# Patient Record
Sex: Female | Born: 1980 | Race: White | Hispanic: No | Marital: Married | Smoking: Never smoker
Health system: Southern US, Community
[De-identification: ages and names within clinical notes are randomized; demographics above are authoritative.]

## PROBLEM LIST (undated history)

## (undated) ENCOUNTER — Inpatient Hospital Stay (HOSPITAL_COMMUNITY): Payer: Self-pay

## (undated) DIAGNOSIS — F988 Other specified behavioral and emotional disorders with onset usually occurring in childhood and adolescence: Secondary | ICD-10-CM

## (undated) DIAGNOSIS — M2669 Other specified disorders of temporomandibular joint: Secondary | ICD-10-CM

## (undated) DIAGNOSIS — I10 Essential (primary) hypertension: Secondary | ICD-10-CM

## (undated) DIAGNOSIS — J45909 Unspecified asthma, uncomplicated: Secondary | ICD-10-CM

## (undated) DIAGNOSIS — M1990 Unspecified osteoarthritis, unspecified site: Secondary | ICD-10-CM

## (undated) DIAGNOSIS — K219 Gastro-esophageal reflux disease without esophagitis: Secondary | ICD-10-CM

## (undated) DIAGNOSIS — M879 Osteonecrosis, unspecified: Secondary | ICD-10-CM

## (undated) DIAGNOSIS — F419 Anxiety disorder, unspecified: Secondary | ICD-10-CM

## (undated) DIAGNOSIS — K589 Irritable bowel syndrome without diarrhea: Secondary | ICD-10-CM

## (undated) DIAGNOSIS — I7300 Raynaud's syndrome without gangrene: Secondary | ICD-10-CM

## (undated) DIAGNOSIS — E71310 Long chain/very long chain acyl CoA dehydrogenase deficiency: Secondary | ICD-10-CM

## (undated) DIAGNOSIS — B009 Herpesviral infection, unspecified: Secondary | ICD-10-CM

## (undated) HISTORY — PX: KNEE ARTHROSCOPY: SUR90

## (undated) HISTORY — PX: WISDOM TOOTH EXTRACTION: SHX21

## (undated) HISTORY — PX: JOINT REPLACEMENT: SHX530

## (undated) HISTORY — PX: REPLACEMENT TOTAL KNEE BILATERAL: SUR1225

## (undated) HISTORY — DX: Other specified disorders of temporomandibular joint: M26.69

## (undated) HISTORY — DX: Osteonecrosis, unspecified: M87.9

## (undated) HISTORY — DX: Anxiety disorder, unspecified: F41.9

---
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
## 2001-08-04 ENCOUNTER — Other Ambulatory Visit: Admission: RE | Admit: 2001-08-04 | Discharge: 2001-08-04 | Payer: Self-pay | Admitting: Obstetrics and Gynecology

## 2002-09-30 ENCOUNTER — Other Ambulatory Visit: Admission: RE | Admit: 2002-09-30 | Discharge: 2002-09-30 | Payer: Self-pay | Admitting: Obstetrics and Gynecology

## 2003-05-19 ENCOUNTER — Encounter: Admission: RE | Admit: 2003-05-19 | Discharge: 2003-05-19 | Payer: Self-pay | Admitting: Family Medicine

## 2003-12-26 ENCOUNTER — Other Ambulatory Visit: Admission: RE | Admit: 2003-12-26 | Discharge: 2003-12-26 | Payer: Self-pay | Admitting: Obstetrics and Gynecology

## 2003-12-26 ENCOUNTER — Other Ambulatory Visit: Admission: RE | Admit: 2003-12-26 | Discharge: 2003-12-26 | Payer: Self-pay | Admitting: *Deleted

## 2004-05-22 ENCOUNTER — Ambulatory Visit (HOSPITAL_COMMUNITY): Admission: RE | Admit: 2004-05-22 | Discharge: 2004-05-22 | Payer: Self-pay | Admitting: Allergy

## 2004-06-03 ENCOUNTER — Emergency Department (HOSPITAL_COMMUNITY): Admission: EM | Admit: 2004-06-03 | Discharge: 2004-06-03 | Payer: Self-pay | Admitting: Emergency Medicine

## 2004-10-03 ENCOUNTER — Ambulatory Visit (HOSPITAL_BASED_OUTPATIENT_CLINIC_OR_DEPARTMENT_OTHER): Admission: RE | Admit: 2004-10-03 | Discharge: 2004-10-03 | Payer: Self-pay | Admitting: Orthopedic Surgery

## 2005-01-21 ENCOUNTER — Other Ambulatory Visit: Admission: RE | Admit: 2005-01-21 | Discharge: 2005-01-21 | Payer: Self-pay | Admitting: Obstetrics and Gynecology

## 2006-01-22 ENCOUNTER — Other Ambulatory Visit: Admission: RE | Admit: 2006-01-22 | Discharge: 2006-01-22 | Payer: Self-pay | Admitting: Obstetrics and Gynecology

## 2008-12-08 ENCOUNTER — Encounter: Admission: RE | Admit: 2008-12-08 | Discharge: 2008-12-08 | Payer: Self-pay

## 2009-06-21 ENCOUNTER — Emergency Department (HOSPITAL_COMMUNITY): Admission: EM | Admit: 2009-06-21 | Discharge: 2009-06-21 | Payer: Self-pay | Admitting: Emergency Medicine

## 2009-12-25 ENCOUNTER — Inpatient Hospital Stay (HOSPITAL_COMMUNITY): Admission: RE | Admit: 2009-12-25 | Discharge: 2009-12-29 | Payer: Self-pay | Admitting: Orthopedic Surgery

## 2010-05-18 ENCOUNTER — Inpatient Hospital Stay (HOSPITAL_COMMUNITY)
Admission: RE | Admit: 2010-05-18 | Discharge: 2010-05-22 | Payer: Self-pay | Source: Home / Self Care | Admitting: Orthopedic Surgery

## 2010-09-18 LAB — BASIC METABOLIC PANEL
BUN: 3 mg/dL — ABNORMAL LOW (ref 6–23)
BUN: 3 mg/dL — ABNORMAL LOW (ref 6–23)
BUN: 4 mg/dL — ABNORMAL LOW (ref 6–23)
CO2: 27 mEq/L (ref 19–32)
CO2: 29 mEq/L (ref 19–32)
CO2: 31 mEq/L (ref 19–32)
Calcium: 8.4 mg/dL (ref 8.4–10.5)
Calcium: 8.4 mg/dL (ref 8.4–10.5)
Calcium: 8.8 mg/dL (ref 8.4–10.5)
Chloride: 101 mEq/L (ref 96–112)
Chloride: 104 mEq/L (ref 96–112)
Chloride: 107 mEq/L (ref 96–112)
Creatinine, Ser: 0.71 mg/dL (ref 0.4–1.2)
Creatinine, Ser: 0.71 mg/dL (ref 0.4–1.2)
Creatinine, Ser: 0.72 mg/dL (ref 0.4–1.2)
GFR calc Af Amer: >60 mL/min (ref >60)
GFR calc Af Amer: >60 mL/min (ref >60)
GFR calc Af Amer: >60 mL/min (ref >60)
GFR calc non Af Amer: >60 mL/min (ref >60)
GFR calc non Af Amer: >60 mL/min (ref >60)
GFR calc non Af Amer: >60 mL/min (ref >60)
Glucose, Bld: 104 mg/dL — ABNORMAL HIGH (ref 70–99)
Glucose, Bld: 174 mg/dL — ABNORMAL HIGH (ref 70–99)
Glucose, Bld: 95 mg/dL (ref 70–99)
Potassium: 3.4 mEq/L — ABNORMAL LOW (ref 3.5–5.1)
Potassium: 3.6 mEq/L (ref 3.5–5.1)
Potassium: 4.2 mEq/L (ref 3.5–5.1)
Sodium: 137 mEq/L (ref 135–145)
Sodium: 139 mEq/L (ref 135–145)
Sodium: 140 mEq/L (ref 135–145)

## 2010-09-18 LAB — CBC
HCT: 28.2 % — ABNORMAL LOW (ref 36.0–46.0)
HCT: 28.7 % — ABNORMAL LOW (ref 36.0–46.0)
HCT: 32 % — ABNORMAL LOW (ref 36.0–46.0)
HCT: 42.6 % (ref 36.0–46.0)
Hemoglobin: 10.9 g/dL — ABNORMAL LOW (ref 12.0–15.0)
Hemoglobin: 14.5 g/dL (ref 12.0–15.0)
Hemoglobin: 9.8 g/dL — ABNORMAL LOW (ref 12.0–15.0)
Hemoglobin: 9.9 g/dL — ABNORMAL LOW (ref 12.0–15.0)
MCH: 27.7 pg (ref 26.0–34.0)
MCH: 28.3 pg (ref 26.0–34.0)
MCH: 28.4 pg (ref 26.0–34.0)
MCH: 28.6 pg (ref 26.0–34.0)
MCHC: 34 g/dL (ref 30.0–36.0)
MCHC: 34.2 g/dL (ref 30.0–36.0)
MCHC: 34.5 g/dL (ref 30.0–36.0)
MCHC: 34.6 g/dL (ref 30.0–36.0)
MCV: 81.4 fL (ref 78.0–100.0)
MCV: 82.4 fL (ref 78.0–100.0)
MCV: 82.6 fL (ref 78.0–100.0)
MCV: 82.8 fL (ref 78.0–100.0)
Platelets: 314 10*3/uL (ref 150–400)
Platelets: 321 10*3/uL (ref 150–400)
Platelets: 374 10*3/uL (ref 150–400)
Platelets: 443 10*3/uL — ABNORMAL HIGH (ref 150–400)
RBC: 3.42 MIL/uL — ABNORMAL LOW (ref 3.87–5.11)
RBC: 3.49 MIL/uL — ABNORMAL LOW (ref 3.87–5.11)
RBC: 3.87 MIL/uL (ref 3.87–5.11)
RBC: 5.24 MIL/uL — ABNORMAL HIGH (ref 3.87–5.11)
RDW: 13 % (ref 11.5–15.5)
RDW: 13.6 % (ref 11.5–15.5)
RDW: 13.8 % (ref 11.5–15.5)
RDW: 13.9 % (ref 11.5–15.5)
WBC: 13.3 10*3/uL — ABNORMAL HIGH (ref 4.0–10.5)
WBC: 15 10*3/uL — ABNORMAL HIGH (ref 4.0–10.5)
WBC: 15.4 10*3/uL — ABNORMAL HIGH (ref 4.0–10.5)
WBC: 19.6 10*3/uL — ABNORMAL HIGH (ref 4.0–10.5)

## 2010-09-18 LAB — URINALYSIS, ROUTINE W REFLEX MICROSCOPIC
Bilirubin Urine: NEGATIVE
Glucose, UA: NEGATIVE mg/dL
Hgb urine dipstick: NEGATIVE
Ketones, ur: NEGATIVE mg/dL
Nitrite: NEGATIVE
Protein, ur: NEGATIVE mg/dL
Specific Gravity, Urine: 1.007 (ref 1.005–1.030)
Urobilinogen, UA: 0.2 mg/dL (ref 0.0–1.0)
pH: 7 (ref 5.0–8.0)

## 2010-09-18 LAB — COMPREHENSIVE METABOLIC PANEL
ALT: 28 U/L (ref 0–35)
AST: 26 U/L (ref 0–37)
Albumin: 4.6 g/dL (ref 3.5–5.2)
Alkaline Phosphatase: 96 U/L (ref 39–117)
BUN: 10 mg/dL (ref 6–23)
CO2: 28 mEq/L (ref 19–32)
Calcium: 10 mg/dL (ref 8.4–10.5)
Chloride: 101 mEq/L (ref 96–112)
Creatinine, Ser: 0.84 mg/dL (ref 0.4–1.2)
GFR calc Af Amer: >60 mL/min (ref >60)
GFR calc non Af Amer: >60 mL/min (ref >60)
Glucose, Bld: 94 mg/dL (ref 70–99)
Potassium: 3.7 mEq/L (ref 3.5–5.1)
Sodium: 140 mEq/L (ref 135–145)
Total Bilirubin: 0.8 mg/dL (ref 0.3–1.2)
Total Protein: 7.8 g/dL (ref 6.0–8.3)

## 2010-09-18 LAB — TYPE AND SCREEN
ABO/RH(D): AB POS
Antibody Screen: NEGATIVE

## 2010-09-18 LAB — SURGICAL PCR SCREEN
MRSA, PCR: NEGATIVE
Staphylococcus aureus: NEGATIVE

## 2010-09-18 LAB — PREGNANCY, URINE: Preg Test, Ur: NEGATIVE

## 2010-09-18 LAB — PROTIME-INR
INR: 0.99 (ref 0.00–1.49)
Prothrombin Time: 13.3 seconds (ref 11.6–15.2)

## 2010-09-18 LAB — APTT: aPTT: 29 seconds (ref 24–37)

## 2010-09-23 LAB — PROTIME-INR
INR: 1.08 (ref 0.00–1.49)
INR: 1.89 — ABNORMAL HIGH (ref 0.00–1.49)
INR: 2.09 — ABNORMAL HIGH (ref 0.00–1.49)
Prothrombin Time: 13.9 seconds (ref 11.6–15.2)
Prothrombin Time: 22.4 seconds — ABNORMAL HIGH (ref 11.6–15.2)
Prothrombin Time: 23.3 seconds — ABNORMAL HIGH (ref 11.6–15.2)

## 2010-09-23 LAB — DIFFERENTIAL
Basophils Absolute: 0 10*3/uL (ref 0.0–0.1)
Basophils Relative: 0 % (ref 0–1)
Eosinophils Absolute: 0 10*3/uL (ref 0.0–0.7)
Eosinophils Relative: 0 % (ref 0–5)
Lymphocytes Relative: 9 % — ABNORMAL LOW (ref 12–46)
Lymphs Abs: 1.9 10*3/uL (ref 0.7–4.0)
Monocytes Absolute: 1.2 10*3/uL — ABNORMAL HIGH (ref 0.1–1.0)
Monocytes Relative: 6 % (ref 3–12)
Neutro Abs: 17.4 10*3/uL — ABNORMAL HIGH (ref 1.7–7.7)
Neutrophils Relative %: 85 % — ABNORMAL HIGH (ref 43–77)

## 2010-09-23 LAB — BASIC METABOLIC PANEL
BUN: 3 mg/dL — ABNORMAL LOW (ref 6–23)
BUN: 3 mg/dL — ABNORMAL LOW (ref 6–23)
CO2: 28 mEq/L (ref 19–32)
CO2: 33 mEq/L — ABNORMAL HIGH (ref 19–32)
Calcium: 8.6 mg/dL (ref 8.4–10.5)
Calcium: 8.7 mg/dL (ref 8.4–10.5)
Chloride: 102 mEq/L (ref 96–112)
Chloride: 106 mEq/L (ref 96–112)
Creatinine, Ser: 0.71 mg/dL (ref 0.4–1.2)
Creatinine, Ser: 0.74 mg/dL (ref 0.4–1.2)
GFR calc Af Amer: >60 mL/min (ref >60)
GFR calc Af Amer: >60 mL/min (ref >60)
GFR calc non Af Amer: >60 mL/min (ref >60)
GFR calc non Af Amer: >60 mL/min (ref >60)
Glucose, Bld: 140 mg/dL — ABNORMAL HIGH (ref 70–99)
Glucose, Bld: 99 mg/dL (ref 70–99)
Potassium: 4.4 mEq/L (ref 3.5–5.1)
Potassium: 4.7 mEq/L (ref 3.5–5.1)
Sodium: 139 mEq/L (ref 135–145)
Sodium: 141 mEq/L (ref 135–145)

## 2010-09-23 LAB — SURGICAL PCR SCREEN
MRSA, PCR: NEGATIVE
Staphylococcus aureus: POSITIVE — AB

## 2010-09-23 LAB — CBC
HCT: 31 % — ABNORMAL LOW (ref 36.0–46.0)
Hemoglobin: 10.2 g/dL — ABNORMAL LOW (ref 12.0–15.0)
MCHC: 33 g/dL (ref 30.0–36.0)
MCV: 88 fL (ref 78.0–100.0)
Platelets: 308 10*3/uL (ref 150–400)
RBC: 3.52 MIL/uL — ABNORMAL LOW (ref 3.87–5.11)
RBC: 3.62 MIL/uL — ABNORMAL LOW (ref 3.87–5.11)
RDW: 12.3 % (ref 11.5–15.5)
WBC: 17.5 10*3/uL — ABNORMAL HIGH (ref 4.0–10.5)
WBC: 20.6 10*3/uL — ABNORMAL HIGH (ref 4.0–10.5)

## 2010-09-23 LAB — TYPE AND SCREEN
ABO/RH(D): AB POS
Antibody Screen: NEGATIVE

## 2010-09-23 LAB — ABO/RH: ABO/RH(D): AB POS

## 2010-09-24 LAB — COMPREHENSIVE METABOLIC PANEL
ALT: 21 U/L (ref 0–35)
AST: 19 U/L (ref 0–37)
Calcium: 9.7 mg/dL (ref 8.4–10.5)
GFR calc Af Amer: >60 mL/min (ref >60)
Glucose, Bld: 95 mg/dL (ref 70–99)
Sodium: 140 mEq/L (ref 135–145)
Total Protein: 7.4 g/dL (ref 6.0–8.3)

## 2010-09-24 LAB — CBC
MCHC: 34 g/dL (ref 30.0–36.0)
RDW: 12.5 % (ref 11.5–15.5)

## 2010-09-24 LAB — APTT: aPTT: 29 seconds (ref 24–37)

## 2010-09-24 LAB — URINALYSIS, ROUTINE W REFLEX MICROSCOPIC
Ketones, ur: NEGATIVE mg/dL
Nitrite: NEGATIVE
Protein, ur: NEGATIVE mg/dL

## 2010-09-24 LAB — PROTIME-INR: Prothrombin Time: 13.9 seconds (ref 11.6–15.2)

## 2010-10-09 LAB — COMPREHENSIVE METABOLIC PANEL
ALT: 31 U/L (ref 0–35)
AST: 32 U/L (ref 0–37)
Alkaline Phosphatase: 52 U/L (ref 39–117)
CO2: 25 mEq/L (ref 19–32)
Chloride: 102 mEq/L (ref 96–112)
Creatinine, Ser: 0.71 mg/dL (ref 0.4–1.2)
GFR calc Af Amer: >60 mL/min (ref >60)
GFR calc non Af Amer: >60 mL/min (ref >60)
Potassium: 4 mEq/L (ref 3.5–5.1)
Total Bilirubin: 0.7 mg/dL (ref 0.3–1.2)

## 2010-10-09 LAB — DIFFERENTIAL
Basophils Absolute: 0.1 10*3/uL (ref 0.0–0.1)
Basophils Relative: 0 % (ref 0–1)
Eosinophils Absolute: 0.1 10*3/uL (ref 0.0–0.7)
Eosinophils Relative: 0 % (ref 0–5)
Lymphocytes Relative: 5 % — ABNORMAL LOW (ref 12–46)
Monocytes Absolute: 0.4 10*3/uL (ref 0.1–1.0)

## 2010-10-09 LAB — URINALYSIS, ROUTINE W REFLEX MICROSCOPIC
Bilirubin Urine: NEGATIVE
Hgb urine dipstick: NEGATIVE
Ketones, ur: NEGATIVE mg/dL
Nitrite: NEGATIVE
Urobilinogen, UA: 0.2 mg/dL (ref 0.0–1.0)

## 2010-10-09 LAB — CBC
MCV: 85.2 fL (ref 78.0–100.0)
RBC: 5 MIL/uL (ref 3.87–5.11)
WBC: 18.9 10*3/uL — ABNORMAL HIGH (ref 4.0–10.5)

## 2010-10-09 LAB — LIPASE, BLOOD: Lipase: 39 U/L (ref 11–59)

## 2010-10-15 ENCOUNTER — Other Ambulatory Visit: Payer: Self-pay | Admitting: Anesthesiology

## 2010-10-15 DIAGNOSIS — M25551 Pain in right hip: Secondary | ICD-10-CM

## 2010-10-17 ENCOUNTER — Ambulatory Visit
Admission: RE | Admit: 2010-10-17 | Discharge: 2010-10-17 | Disposition: A | Discharge status: Home / Self Care | Payer: PRIVATE HEALTH INSURANCE | Source: Ambulatory Visit | Attending: Anesthesiology | Admitting: Anesthesiology

## 2010-10-17 DIAGNOSIS — M25552 Pain in left hip: Secondary | ICD-10-CM

## 2010-10-17 DIAGNOSIS — M25551 Pain in right hip: Secondary | ICD-10-CM

## 2010-11-23 NOTE — Op Note (Signed)
NAMEBRIGETTE, [name]             ACCOUNT NO.:  [id number]   MEDICAL RECORD NO.:  [id number]          PATIENT TYPE:  AMB   LOCATION:  DSC                          FACILITY:  [facility]   PHYSICIAN:  Harvie Junior, M.D.   DATE OF BIRTH:  August 16, 1980   DATE OF PROCEDURE:  10/03/2004  DATE OF DISCHARGE:                                 OPERATIVE REPORT   PREOPERATIVE DIAGNOSIS:  Medial and lateral osteochondral defects femur.   POSTOPERATIVE DIAGNOSIS:  Medial and lateral osteochondral defects femur.   PRINCIPAL PROCEDURES:  1.  Drilling of intact osteochondral defect lateral femoral condyle.  2.  Drilling of intact osteochondral defect medial femoral condyle.  3.  Synovectomy.   SURGEON:  Harvie Junior, M.D.   ASSISTANT:  Marshia Ly, P.A.   ANESTHESIA:  General anesthesia.   BRIEF HISTORY:  A 30 year old female with long history of having significant  right knee pain.  She ultimately was evaluated with MRI which showed she had  large medial and lateral osteochondral defects.  There was some question  about fluid involved behind these lesions.  Ultimately felt that the most  appropriate course of action at this point was going to be drilling through  the articular cartilage of these defects and this was undertaken.  Fluoroscopy was brought in to insure the angle was appropriate and we  drilled through a small defect created by a 6.2 K-wire.  I was able to  actually get a house curette through this same defect and the articular  __________ curette out behind there.  There was a fair amount of healing  products which we did seem to come through the articular cartilage.  At that  point, multiple drill sites were undertaken through this intact articular  cartilage defect.  At this point, the wound was copiously irrigated and  suctioned dry.  Attention was turned over to the medial femoral condyle  where similar procedure was performed with x-ray localization of 6.2 K-  wires.  Once  this was undertaken, medial and lateral shelves were debrided.  There was minimal change in the patellofemoral joint which was debrided.  At  this point, the knee was copiously irrigated and suctioned dry.  Final check  was made for loosening fragmented pieces, seeing none.  The knee was closed  with a bandage.  Sterile compressive dressing was applied.  The patient was  taken to the recovery room and was noted to be in satisfactory condition.   ESTIMATED BLOOD LOSS:  None.     JLG/MEDQ  D:  10/03/2004  T:  10/03/2004  Job:  [id number]

## 2010-11-23 NOTE — Consult Note (Signed)
Kelly [name], Kelly [name]             ACCOUNT NO.:  [id number]   MEDICAL RECORD NO.:  [id number]           PATIENT TYPE:   LOCATION:                                 FACILITY:   PHYSICIAN:  R. Roetta Sessions, M.D. DATE OF BIRTH:  17-Feb-1981   DATE OF CONSULTATION:  DATE OF DISCHARGE:  06/03/2004                                   CONSULTATION   CHIEF COMPLAINT:  Diarrhea, low abdominal pain.   HISTORY OF PRESENT ILLNESS:  Kelly [name] is a 30 year old Caucasian female  who reports a 3-week history of diarrhea. She is noting loose stools with an  average of x4 daily. She denies any mucus in her stools although she does  report bright red rectal bleeding in small amounts with wiping on the toilet  paper.  She was seen by Dr. Doyne Keel and has completed 8 days of Colocort.  She also notes bilateral lower quadrant abdominal cramping and tenderness  which is relieved post defecation. She denies any nausea or vomiting, fever  or chills. She does report anorexia. She denies any recent antibiotics,  foreign travel, ill contacts, or new pets or any medications.  She has been  using Analpram as well for her hemorrhoidal disease and denies any aspirin  or NSAID use.   She also had colonoscopy in 2001 by Dr. Gabriel Cirri for blood in her stool.  Biopsy was obtained from the sigmoid colon which revealed chronic colitis  and findings revealed mild inflammatory changes in the sigmoid colon. She  has not had any episodic diarrhea prior to this.   PAST MEDICAL HISTORY:  1.  Hypercholesterolemia controlled by diet.  2.  Hormone replacement therapy.  3.  Last colonoscopy as described in HPI by Dr. Gabriel Cirri.  4.  Previous colonoscopy in 1999 by Dr. Gabriel Cirri which was reportedly normal.   PAST SURGICAL HISTORY:  Tonsillectomy as a child; appendectomy at age 30;  hysterectomy with left oophorectomy in 1976; right rotator cuff repair 3  years ago; right ear surgery.   CURRENT MEDICATIONS:  1.  Aspirin 81 mg  daily.  2.  Vitamin B-12 daily.  3.  Vitamin C daily.  4.  Premarin 0.625 mg daily.  5.  Fish oil 500 mg daily.   ALLERGIES:  CODEINE (CAUSES NAUSEA).   FAMILY HISTORY:  Positive for her father diagnosed with colon cancer in his  14's, deceased at age 9 secondary to coronary artery disease.  Mother  deceased at age 76 secondary to pulmonary fibrosis.   SOCIAL HISTORY:  Kelly [name] has been married for 31 years. She has two  grown healthy children. She is a retired Conservation officer, nature. She denies any  tobacco, alcohol or drug use.   REVIEW OF SYSTEMS:  CONSTITUTIONAL:  Reported 4 pound loss since onset of  diarrhea. Denies fever or chills. She is complaining of anorexia, denies  early satiety. CARDIOVASCULAR:  She did have an episode of chest pain  associated with shortness of breath while attending a funeral recently.  Has  been seen and underwent stress testing which was cardiac negative.  GI:  See HPI. She is  also complaining of belching. Denies heart burn,  indigestion, dysphagia or odynophagia.   PHYSICAL EXAMINATION:  VITAL SIGNS:  Weight 137 pounds, height 53 inches.  Temperature 98.2, blood pressure  156/70, pulse 60.  GENERAL:  Kelly [name] is a well-developed, well-nourished Caucasian female  who is alert and oriented, pleasant and cooperative. In no acute distress.  HEENT:  Sclerae are clear, anicteric. Conjunctivae pink. Oropharynx pink and  moist without lesions.  CHEST:  Heart regular rate and rhythm with normal S1, S2, without murmurs,  rubs, or gallops.  LUNGS:  Clear to auscultation bilaterally.  ABDOMEN:  Flat with positive bowel sounds, no bruits auscultated. Soft,  nontender, nondistended without hepatosplenomegaly.  No rebound tenderness  or guarding.  RECTAL:  She does have a large protruding external hemorrhoid that is not  erythematous or thrombosed. No active bleeding.  She does have good  sphincter tone. There are no internal masses palpated, and small  amount of  light brown stool was Hemoccult negative from the vault.  EXTREMITIES:  Pedal pulses 2+ bilaterally, no edema.  SKIN:  Pink, arm and dry without any rash or jaundice.   ASSESSMENT:  Kelly [name] is a 30 year old Caucasian female with a 3-week  history of persistent diarrhea. She has history of colonoscopy in the past  which revealed sigmoid colitis. She was empirically treated with Colocort by  Dr. Doyne Keel although her symptoms persist. She also noted bilateral lower  quadrant abdominal tenderness. Denies any real fever or chills. She does  have some nausea without any emesis.  Her symptoms are definitely worrisome  for inflammatory bowel disease and given her family history of colon cancer,  would recommend reevaluating colonoscopy at this time.  Other possibilities  include irritable bowel syndrome, diverticulitis is less likely given the  fact that she denies any history of diverticulosis.  Also infectious  etiology should be ruled out as well.   RECOMMENDATIONS:  1.  Full set of stool studies.  2.  Baseline labs including CBC, sed rate, and CMP.  3.  She is to hold the enemas for now. She can continue Analpram as needed.  4.  She can begin Imodium 2 mg two tablets at onset of diarrhea up to four      per day.  5.  Will schedule colonoscopy with Dr. Jena Gauss in the near future. She is to      hold her aspirin 3 days prior to the procedure.   Risks and benefits which include bleeding, perforation were explained to the  patient and she agrees with this plan and consent will be obtained.     Kand   KC/MEDQ  D:  07/06/2004  T:  07/06/2004  Job:  [id number]

## 2010-11-23 NOTE — H&P (Signed)
[facility]  Patient:    JOHNA, KEARL                    MRN: [id number] Adm. Date:  [id number] Disc. Date: [id number] Attending:  Milford Cage H CC:         Dr. Loni Dolly A. Dub Mikes, M.D.                   Psychiatric Admission Assessment  IDENTIFICATION:  This is a 30 year old Caucasian female admitted on a voluntary basis referred by Dr. Bennetta Laos, her psychologist.  HISTORY OF PRESENT ILLNESS:  The patient has been despondent and depressed with multiple neurovegetative symptoms.  These include anhedonia, anergia, a 22 pound weight loss and severe insomnia (DFA, MA, and EMA).  Patient began to threaten suicide after her parents insisted that she stop seeing her boyfriend.  They feel he is too controlling and gets her involved in negative activities.  Yesterday she was caught shoplifting at [employer] with him.  PAST PSYCHIATRIC HISTORY:  Patient sees Dr. Dub Mikes at [facility] for medication management.  She sees Dr. Bennetta Laos for therapy. Patient was on Zoloft 50 mg p.o. q.a.m., but last week Dr. Dub Mikes increased this to Zoloft 100 mg p.o. q.a.m.  She is also on Ambien 10 mg p.o. q.h.s.  SUBSTANCE ABUSE HISTORY:  None.  PAST MEDICAL HISTORY:  Patient does suffer from asthma.  She uses Singulair, _________ , Vanceril, and Proventil.  She has an ovarian cyst and was on birth control for this; however, she discontinued these due to nausea from the birth control pills.  She has had a recent urinary tract infection which was not adequately treated.  She will be restarted on her Macrobid 100 mg p.o. b.i.d. x 7 days to treat this.  SOCIAL HISTORY:  Patient lives with her family.  She currently attends [employer]; however, she has dropped out of many extracurricular activities and her parents blame her boyfriend for this.  ABUSE HISTORY:  None.  FAMILY PSYCHIATRIC HISTORY:  Positive for mother  suffering from anxiety and depression.  It is unclear whether she is on any medications at this point. The legal problems revolve around having gotten caught shoplifting on the day prior to admission at [employer] (took a CD player).  ADMISSION MENTAL STATUS EXAMINATION:  Patient presented as a friendly, cooperative Caucasian female with poor eye contact.  Speech was soft and slow and there was no articulation disorder.  There was psychomotor retardation, mood depressed, affect sad and constricted, affect also tearful and sobbing at times.  Positive suicidal ideation as per history of present illness.  There was no homicidal ideation.  There was no psychosis.  Cognitive exam was within normal limits.  ADMISSION DIAGNOSES:   AXIS I:  Depressive disorder, not otherwise specified.  AXIS II:  Deferred. AXIS III:  Recent weight loss.  AXIS IV:  Severe.   AXIS V:  GAF is 15.  PATIENT ASSETS AND STRENGTHS:  Healthy, able to be physically active, supportive family.  PROBLEMS:  Mood instability with suicidal ideation and conduct problems.  SHORT TERM TREATMENT GOAL:  Resolution of suicidal ideation.  LONG TERM TREATMENT GOAL:  Resolution of mood instability and conduct problems.  INITIAL PLAN OF CARE:  Continue Zoloft dose of 100 mg p.o. q.a.m.  Continue Ambien 10 mg p.o. q.h.s.  Continue asthma medications.  Patient  will also be involved in unit therapeutic groups and activities and family therapy. Estimated length of inpatient treatment 5-7 days.  CONDITION NECESSARY FOR DISCHARGE:  Less depressed, not suicidal.  INITIAL DISCHARGE PLANS:  Patient will for follow-up.  Medication management will be with a child psychiatrist on their panel as well as  ALLERGIES:  No known drug allergies. DD:  11/15/99 TD:  11/19/99 Job: [id number]

## 2010-11-23 NOTE — Discharge Summary (Signed)
[facility]  Patient:    Kelly [name], Kelly [name]                    MRN: [id number] Adm. Date:  [id number] Disc. Date: [id number] Attending:  Milford Cage H                           Discharge Summary  PATIENT IDENTIFICATION:  Patient was a 30 year old female.  INITIAL ASSESSMENT AND DIAGNOSIS:  Patient had been admitted to the service of Dr. Milford Cage.  She had been despondent and depressed with various neurovegetative symptoms.  She had loss of energy, loss of interest, 22-pound weight loss reportedly, severe insomnia.  She had begun to threaten suicide after her parents insisted she stop seeing her boyfriend.  They believed the boyfriend was too controlling and got her involved in too many negative activities.  Yesterday, prior to admission, she was caught shoplifting with her boyfriend.  MENTAL STATUS:  At the time of the initial evaluation, revealed an alert, oriented young woman who was friendly and cooperative.  She looked sad and depressed.  Was tearful at times during the interview.  She was positive for suicidal ideation per history.  There was no evidence of any psychotic thinking.  She seemed to be at least normal intelligence.  Other pertinent history can be obtained from the psychosocial service summary.  PHYSICAL EXAMINATION:  Noncontributory.  ADMITTING DIAGNOSES: Axis I:    Depressive disorder not otherwise specified. Axis II:   Deferred. Axis III:  Weight loss. Axis IV:   Severe. Axis V:    15.  FINDINGS:  All indicated laboratory examinations were within normal limits or noncontributory.  HOSPITAL COURSE:  While in the hospital, patient talked about her relationship with the boyfriend.  That was the focus throughout her stay.  Her parents indicated that she had been given chances to pull things together.  She told her parents they promised she could see him unless she violated the rules. She wanted one more chance.  They gave  an example of violating the rules as getting caught shoplifting as well as finding a cooler in the backyard with beers in it which she would not say it came from.  Even to the last day, she was still bothered that her parents were saying enough was enough and she would no longer see the boyfriend.  They did give her permission to call him on the phone and to accept calls from him but would not be allowed to see. She consistently denied any suicidal threats or thoughts in the hospital.  Her parents were willing to take her home and she wanted to go home and she was discharged.  POST-HOSPITAL CARE PLANS:  She will follow up with Dr. Dub Mikes at [facility] with an appointment for 11/20/99.  She was referred to the intensive outpatient program at this hospital beginning the Monday after discharge.  DISCHARGE MEDICATIONS: 1. Vistaril 50 mg q.h.s. 2. Zoloft 100 mg q.a.m. 3. Other medications for her asthma as prescribed by her outpatient    doctor. 4. Macrobid 100 mg b.i.d. x 5 days which also been prescribed prior    to her admission.  ACTIVITY AND DIET:  There were no restrictions placed on her activity or her diet.  FINAL DIAGNOSES: Axis I:    1. Depressive disorder not otherwise specified.            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 Axis II:   No diagnosis. Axis III:  Weight loss. Axis IV:   Severe. Axis V:    60. DD:  11/26/99 TD:  11/27/99 Job: [id number]

## 2011-11-28 ENCOUNTER — Ambulatory Visit (INDEPENDENT_AMBULATORY_CARE_PROVIDER_SITE_OTHER): Payer: PRIVATE HEALTH INSURANCE | Admitting: Internal Medicine

## 2011-11-28 VITALS — BP 122/80 | HR 88 | Temp 98.5°F | Resp 16 | Ht 65.0 in | Wt 121.4 lb

## 2011-11-28 DIAGNOSIS — R630 Anorexia: Secondary | ICD-10-CM

## 2011-11-28 DIAGNOSIS — M879 Osteonecrosis, unspecified: Secondary | ICD-10-CM | POA: Insufficient documentation

## 2011-11-28 DIAGNOSIS — R112 Nausea with vomiting, unspecified: Secondary | ICD-10-CM

## 2011-11-28 DIAGNOSIS — R1013 Epigastric pain: Secondary | ICD-10-CM

## 2011-11-28 DIAGNOSIS — I7300 Raynaud's syndrome without gangrene: Secondary | ICD-10-CM | POA: Insufficient documentation

## 2011-11-28 DIAGNOSIS — R634 Abnormal weight loss: Secondary | ICD-10-CM

## 2011-11-28 DIAGNOSIS — F909 Attention-deficit hyperactivity disorder, unspecified type: Secondary | ICD-10-CM | POA: Insufficient documentation

## 2011-11-28 DIAGNOSIS — Z79899 Other long term (current) drug therapy: Secondary | ICD-10-CM

## 2011-11-28 DIAGNOSIS — R1011 Right upper quadrant pain: Secondary | ICD-10-CM

## 2011-11-28 DIAGNOSIS — G8929 Other chronic pain: Secondary | ICD-10-CM | POA: Insufficient documentation

## 2011-11-28 DIAGNOSIS — M069 Rheumatoid arthritis, unspecified: Secondary | ICD-10-CM

## 2011-11-28 LAB — POCT SEDIMENTATION RATE: POCT SED RATE: 5 mm/hr (ref 0–22)

## 2011-11-28 LAB — POCT CBC
Granulocyte percent: 69.2 %G (ref 37–80)
HCT, POC: 44.3 % (ref 37.7–47.9)
Hemoglobin: 14.2 g/dL (ref 12.2–16.2)
MCHC: 32.1 g/dL (ref 31.8–35.4)
MPV: 9.5 fL (ref 0–99.8)
POC Granulocyte: 6.9 (ref 2–6.9)
POC MID %: 4.8 %M (ref 0–12)
RDW, POC: 13.1 %

## 2011-11-28 MED ORDER — OMEPRAZOLE 40 MG PO CPDR: 40.0000 mg | DELAYED_RELEASE_CAPSULE | Freq: Every day | ORAL | Status: DC

## 2011-11-28 MED ORDER — ONDANSETRON HCL 8 MG PO TABS: 8.0000 mg | ORAL_TABLET | Freq: Three times a day (TID) | ORAL | Status: AC | PRN

## 2011-11-28 NOTE — Progress Notes (Signed)
  Subjective:    Patient ID: Kelly [name], female    DOB: 01-Dec-1980, 31 y.o.   MRN: [id number]  HPI C/o 3 mos of mid epig and ruq abdomional pain, has anorexia with 46 lbs of weight loss!! Occ nausea with vomiting, no diarrhea. Hx of acid reflux.    Review of Systems Complex hx with RA, raynauds, osteonecrosis with knee replacements, htn, mood disorder Objective:   Physical Exam  Constitutional: She appears well-developed and well-nourished.  HENT:  Right Ear: External ear normal.  Left Ear: External ear normal.  Mouth/Throat: Oropharynx is clear and moist.  Eyes: No scleral icterus.  Neck: Normal range of motion. Neck supple. No thyromegaly present.  Cardiovascular: Normal rate, regular rhythm and normal heart sounds.   Pulmonary/Chest: Effort normal.  Abdominal: Soft. Normal appearance. She exhibits no mass. There is no hepatosplenomegaly. There is tenderness in the right upper quadrant and epigastric area. There is CVA tenderness and positive Murphy's sign. There is no rebound. No hernia.  Lymphadenopathy:    She has no cervical adenopathy.  Skin: Skin is warm and dry.  Psychiatric: She has a normal mood and affect. Her behavior is normal.   Results for orders placed in visit on 11/28/11  POCT CBC      Component Value Range   WBC 9.9  4.6 - 10.2 (K/uL)   Lymph, poc 2.6  0.6 - 3.4    POC LYMPH PERCENT 26.0  10 - 50 (%L)   MID (cbc) 0.5  0 - 0.9    POC MID % 4.8  0 - 12 (%M)   POC Granulocyte 6.9  2 - 6.9    Granulocyte percent 69.2  37 - 80 (%G)   RBC 4.92  4.04 - 5.48 (M/uL)   Hemoglobin 14.2  12.2 - 16.2 (g/dL)   HCT, POC 40.9  81.1 - 47.9 (%)   MCV 90.0  80 - 97 (fL)   MCH, POC 28.9  27 - 31.2 (pg)   MCHC 32.1  31.8 - 35.4 (g/dL)   RDW, POC 91.4     Platelet Count, POC 443 (*) 142 - 424 (K/uL)   MPV 9.5  0 - 99.8 (fL)   Sed rate       Assessment & Plan:   RUQ and mid epigastric pain for 3 months, progressive SChed US abdomen

## 2011-11-29 LAB — COMPREHENSIVE METABOLIC PANEL
ALT: 16 U/L (ref 0–35)
AST: 17 U/L (ref 0–37)
CO2: 27 mEq/L (ref 19–32)
Chloride: 104 mEq/L (ref 96–112)
Creat: 0.78 mg/dL (ref 0.50–1.10)
Sodium: 142 mEq/L (ref 135–145)
Total Bilirubin: 0.8 mg/dL (ref 0.3–1.2)
Total Protein: 7.6 g/dL (ref 6.0–8.3)

## 2011-11-29 LAB — TSH: TSH: 1.196 u[IU]/mL (ref 0.350–4.500)

## 2011-12-03 ENCOUNTER — Ambulatory Visit (INDEPENDENT_AMBULATORY_CARE_PROVIDER_SITE_OTHER): Payer: PRIVATE HEALTH INSURANCE | Admitting: Internal Medicine

## 2011-12-03 ENCOUNTER — Ambulatory Visit
Admission: RE | Admit: 2011-12-03 | Discharge: 2011-12-03 | Disposition: A | Discharge status: Home / Self Care | Payer: PRIVATE HEALTH INSURANCE | Source: Ambulatory Visit | Attending: Internal Medicine | Admitting: Internal Medicine

## 2011-12-03 VITALS — BP 118/80 | HR 91 | Temp 97.6°F | Resp 20 | Ht 64.5 in | Wt 118.0 lb

## 2011-12-03 DIAGNOSIS — R630 Anorexia: Secondary | ICD-10-CM

## 2011-12-03 DIAGNOSIS — R634 Abnormal weight loss: Secondary | ICD-10-CM

## 2011-12-03 DIAGNOSIS — R1011 Right upper quadrant pain: Secondary | ICD-10-CM

## 2011-12-03 DIAGNOSIS — A048 Other specified bacterial intestinal infections: Secondary | ICD-10-CM

## 2011-12-03 DIAGNOSIS — R5381 Other malaise: Secondary | ICD-10-CM

## 2011-12-03 DIAGNOSIS — R5383 Other fatigue: Secondary | ICD-10-CM

## 2011-12-03 DIAGNOSIS — R1013 Epigastric pain: Secondary | ICD-10-CM

## 2011-12-03 NOTE — Progress Notes (Signed)
  Subjective:    Patient ID: Kelly [name], female    DOB: Feb 23, 1981, 31 y.o.   MRN: [id number]  HPI Abdominal US is completely normal. Sxs are the same H pylori elevated. Hx of being txed unclear, but possible  Review of Systems    same Objective:   Physical Exam same       Assessment & Plan:  Refer to GI within 10d Use omeprazole

## 2011-12-27 ENCOUNTER — Other Ambulatory Visit (HOSPITAL_COMMUNITY): Payer: Self-pay | Admitting: Gastroenterology

## 2011-12-27 DIAGNOSIS — R109 Unspecified abdominal pain: Secondary | ICD-10-CM

## 2012-01-06 ENCOUNTER — Other Ambulatory Visit (HOSPITAL_COMMUNITY): Payer: PRIVATE HEALTH INSURANCE

## 2012-01-07 ENCOUNTER — Encounter (HOSPITAL_COMMUNITY)
Admission: RE | Admit: 2012-01-07 | Discharge: 2012-01-07 | Disposition: A | Discharge status: Home / Self Care | Payer: PRIVATE HEALTH INSURANCE | Source: Ambulatory Visit | Attending: Gastroenterology | Admitting: Gastroenterology

## 2012-01-07 DIAGNOSIS — R109 Unspecified abdominal pain: Secondary | ICD-10-CM | POA: Insufficient documentation

## 2012-01-07 MED ORDER — TECHNETIUM TC 99M MEBROFENIN IV KIT
5.0000 | PACK | Freq: Once | INTRAVENOUS | Status: AC | PRN
  Administered 2012-01-07: 5 via INTRAVENOUS

## 2012-01-07 MED ORDER — SINCALIDE 5 MCG IJ SOLR: 0.0200 ug/kg | Freq: Once | INTRAMUSCULAR | Status: DC

## 2012-01-21 ENCOUNTER — Other Ambulatory Visit: Payer: Self-pay | Admitting: Gastroenterology

## 2012-01-21 DIAGNOSIS — R634 Abnormal weight loss: Secondary | ICD-10-CM

## 2012-01-21 DIAGNOSIS — R109 Unspecified abdominal pain: Secondary | ICD-10-CM

## 2012-01-23 ENCOUNTER — Other Ambulatory Visit (HOSPITAL_COMMUNITY): Payer: Self-pay | Admitting: Gastroenterology

## 2012-01-23 ENCOUNTER — Ambulatory Visit
Admission: RE | Admit: 2012-01-23 | Discharge: 2012-01-23 | Disposition: A | Discharge status: Home / Self Care | Payer: PRIVATE HEALTH INSURANCE | Source: Ambulatory Visit | Attending: Gastroenterology | Admitting: Gastroenterology

## 2012-01-23 ENCOUNTER — Other Ambulatory Visit: Payer: Self-pay | Admitting: Gastroenterology

## 2012-01-23 DIAGNOSIS — R634 Abnormal weight loss: Secondary | ICD-10-CM

## 2012-01-23 DIAGNOSIS — R109 Unspecified abdominal pain: Secondary | ICD-10-CM

## 2012-01-23 MED ORDER — IOHEXOL 300 MG/ML  SOLN
100.0000 mL | Freq: Once | INTRAMUSCULAR | Status: AC | PRN
  Administered 2012-01-23: 100 mL via INTRAVENOUS

## 2012-01-30 ENCOUNTER — Encounter (HOSPITAL_COMMUNITY)
Admission: RE | Admit: 2012-01-30 | Discharge: 2012-01-30 | Disposition: A | Discharge status: Home / Self Care | Payer: PRIVATE HEALTH INSURANCE | Source: Ambulatory Visit | Attending: Gastroenterology | Admitting: Gastroenterology

## 2012-01-30 DIAGNOSIS — R109 Unspecified abdominal pain: Secondary | ICD-10-CM

## 2012-01-30 MED ORDER — TECHNETIUM TC 99M SULFUR COLLOID
2.0000 | Freq: Once | INTRAVENOUS | Status: AC | PRN
  Administered 2012-01-30: 2 via INTRAVENOUS

## 2012-05-27 ENCOUNTER — Telehealth: Payer: Self-pay

## 2012-05-27 MED ORDER — AMLODIPINE BESYLATE 10 MG PO TABS: 10.0000 mg | ORAL_TABLET | Freq: Every day | ORAL | Status: DC

## 2012-05-27 NOTE — Telephone Encounter (Signed)
PATIENT STATES SHE CALLED HER PHARMACY TO GET A REFILL ON HER AMLODIPINE 10MG  TABLETS. THEY TOLD HER SHE DID NOT HAVE ANY REFILLS AND THAT SHE NEEDED TO CALL HER DOCTOR'S OFFICE. BEST PHONE [phone] (CELL)   PHARMACY CHOICE IS [facility] ON [address].   MBC

## 2012-05-27 NOTE — Telephone Encounter (Signed)
Sent in pt advised 

## 2012-07-20 ENCOUNTER — Telehealth: Payer: Self-pay

## 2012-07-20 NOTE — Telephone Encounter (Signed)
Patient is wanting to

## 2012-07-21 ENCOUNTER — Encounter (INDEPENDENT_AMBULATORY_CARE_PROVIDER_SITE_OTHER): Payer: BC Managed Care – PPO | Admitting: Physician Assistant

## 2012-10-09 ENCOUNTER — Encounter: Payer: Self-pay | Admitting: Internal Medicine

## 2013-01-06 NOTE — Progress Notes (Signed)
This encounter was created in error - please disregard.

## 2013-09-20 ENCOUNTER — Other Ambulatory Visit (HOSPITAL_COMMUNITY): Payer: Self-pay | Admitting: Obstetrics and Gynecology

## 2013-09-20 DIAGNOSIS — Z3141 Encounter for fertility testing: Secondary | ICD-10-CM

## 2013-09-23 ENCOUNTER — Ambulatory Visit (HOSPITAL_COMMUNITY)
Admission: RE | Admit: 2013-09-23 | Discharge: 2013-09-23 | Disposition: A | Discharge status: Home / Self Care | Payer: BC Managed Care – PPO | Source: Ambulatory Visit | Attending: Obstetrics and Gynecology | Admitting: Obstetrics and Gynecology

## 2013-09-23 DIAGNOSIS — N854 Malposition of uterus: Secondary | ICD-10-CM | POA: Insufficient documentation

## 2013-09-23 DIAGNOSIS — Z3141 Encounter for fertility testing: Secondary | ICD-10-CM | POA: Insufficient documentation

## 2013-09-23 MED ORDER — IOHEXOL 300 MG/ML  SOLN
20.0000 mL | Freq: Once | INTRAMUSCULAR | Status: AC | PRN
  Administered 2013-09-23: 20 mL

## 2013-12-30 LAB — OB RESULTS CONSOLE ABO/RH: RH TYPE: POSITIVE

## 2013-12-30 LAB — OB RESULTS CONSOLE RUBELLA ANTIBODY, IGM: Rubella: IMMUNE

## 2013-12-30 LAB — OB RESULTS CONSOLE HEPATITIS B SURFACE ANTIGEN: Hepatitis B Surface Ag: NEGATIVE

## 2013-12-30 LAB — OB RESULTS CONSOLE RPR: RPR: NONREACTIVE

## 2013-12-30 LAB — OB RESULTS CONSOLE HIV ANTIBODY (ROUTINE TESTING): HIV: NONREACTIVE

## 2013-12-30 LAB — OB RESULTS CONSOLE ANTIBODY SCREEN: Antibody Screen: NEGATIVE

## 2014-01-06 LAB — OB RESULTS CONSOLE GC/CHLAMYDIA
Chlamydia: NEGATIVE
Gonorrhea: NEGATIVE

## 2014-05-13 ENCOUNTER — Other Ambulatory Visit (HOSPITAL_COMMUNITY): Payer: PRIVATE HEALTH INSURANCE

## 2014-05-13 ENCOUNTER — Inpatient Hospital Stay (HOSPITAL_COMMUNITY)
Admit: 2014-05-13 | Discharge: 2014-05-13 | Disposition: A | Discharge status: Home / Self Care | Payer: BC Managed Care – PPO | Attending: Medical | Admitting: Medical

## 2014-05-13 ENCOUNTER — Encounter (HOSPITAL_COMMUNITY): Payer: Self-pay | Admitting: *Deleted

## 2014-05-13 ENCOUNTER — Inpatient Hospital Stay (HOSPITAL_COMMUNITY)
Admission: AD | Admit: 2014-05-13 | Discharge: 2014-05-13 | Disposition: A | Discharge status: Home / Self Care | Payer: BC Managed Care – PPO | Source: Ambulatory Visit | Attending: Obstetrics and Gynecology | Admitting: Obstetrics and Gynecology

## 2014-05-13 ENCOUNTER — Inpatient Hospital Stay (HOSPITAL_COMMUNITY): Payer: BC Managed Care – PPO

## 2014-05-13 DIAGNOSIS — O26899 Other specified pregnancy related conditions, unspecified trimester: Secondary | ICD-10-CM | POA: Insufficient documentation

## 2014-05-13 DIAGNOSIS — R109 Unspecified abdominal pain: Secondary | ICD-10-CM | POA: Insufficient documentation

## 2014-05-13 DIAGNOSIS — N390 Urinary tract infection, site not specified: Secondary | ICD-10-CM

## 2014-05-13 DIAGNOSIS — Z3A28 28 weeks gestation of pregnancy: Secondary | ICD-10-CM | POA: Insufficient documentation

## 2014-05-13 DIAGNOSIS — O2343 Unspecified infection of urinary tract in pregnancy, third trimester: Secondary | ICD-10-CM | POA: Insufficient documentation

## 2014-05-13 DIAGNOSIS — D72829 Elevated white blood cell count, unspecified: Secondary | ICD-10-CM | POA: Insufficient documentation

## 2014-05-13 DIAGNOSIS — R1030 Lower abdominal pain, unspecified: Secondary | ICD-10-CM

## 2014-05-13 HISTORY — DX: Unspecified asthma, uncomplicated: J45.909

## 2014-05-13 LAB — CBC WITH DIFFERENTIAL/PLATELET
BASOS ABS: 0 10*3/uL (ref 0.0–0.1)
BASOS PCT: 0 % (ref 0–1)
Eosinophils Absolute: 0.1 10*3/uL (ref 0.0–0.7)
Eosinophils Relative: 1 % (ref 0–5)
HEMATOCRIT: 33.7 % — AB (ref 36.0–46.0)
Hemoglobin: 11.7 g/dL — ABNORMAL LOW (ref 12.0–15.0)
LYMPHS PCT: 10 % — AB (ref 12–46)
Lymphs Abs: 2 10*3/uL (ref 0.7–4.0)
MCH: 29.9 pg (ref 26.0–34.0)
MCHC: 34.7 g/dL (ref 30.0–36.0)
MCV: 86.2 fL (ref 78.0–100.0)
MONO ABS: 1 10*3/uL (ref 0.1–1.0)
Monocytes Relative: 5 % (ref 3–12)
NEUTROS ABS: 16.3 10*3/uL — AB (ref 1.7–7.7)
Neutrophils Relative %: 84 % — ABNORMAL HIGH (ref 43–77)
PLATELETS: 300 10*3/uL (ref 150–400)
RBC: 3.91 MIL/uL (ref 3.87–5.11)
RDW: 12.7 % (ref 11.5–15.5)
WBC: 19.3 10*3/uL — AB (ref 4.0–10.5)

## 2014-05-13 LAB — OB RESULTS CONSOLE GBS: GBS: POSITIVE

## 2014-05-13 LAB — URINE MICROSCOPIC-ADD ON

## 2014-05-13 LAB — URINALYSIS, ROUTINE W REFLEX MICROSCOPIC
BILIRUBIN URINE: NEGATIVE
Glucose, UA: NEGATIVE mg/dL
HGB URINE DIPSTICK: NEGATIVE
KETONES UR: NEGATIVE mg/dL
NITRITE: NEGATIVE
PROTEIN: NEGATIVE mg/dL
SPECIFIC GRAVITY, URINE: 1.01 (ref 1.005–1.030)
UROBILINOGEN UA: 0.2 mg/dL (ref 0.0–1.0)
pH: 7.5 (ref 5.0–8.0)

## 2014-05-13 MED ORDER — DEXTROSE 5 % IV SOLN
1.0000 g | Freq: Once | INTRAVENOUS | Status: AC
  Administered 2014-05-13: 1 g via INTRAVENOUS
  Filled 2014-05-13: qty 10

## 2014-05-13 MED ORDER — HYDROMORPHONE HCL 1 MG/ML IJ SOLN
1.0000 mg | Freq: Once | INTRAMUSCULAR | Status: AC
  Administered 2014-05-13: 1 mg via INTRAMUSCULAR
  Filled 2014-05-13: qty 1

## 2014-05-13 MED ORDER — BUTORPHANOL TARTRATE 1 MG/ML IJ SOLN
1.0000 mg | Freq: Once | INTRAMUSCULAR | Status: AC
  Administered 2014-05-13: 1 mg via INTRAVENOUS
  Filled 2014-05-13: qty 1

## 2014-05-13 MED ORDER — HYDROMORPHONE HCL 1 MG/ML IJ SOLN: 1.0000 mg | Freq: Once | INTRAMUSCULAR | Status: DC

## 2014-05-13 MED ORDER — PROMETHAZINE HCL 25 MG/ML IJ SOLN
12.5000 mg | Freq: Once | INTRAMUSCULAR | Status: AC
  Administered 2014-05-13: 12.5 mg via INTRAVENOUS
  Filled 2014-05-13: qty 1

## 2014-05-13 MED ORDER — SODIUM CHLORIDE 0.9 % IV SOLN
Freq: Once | INTRAVENOUS | Status: AC
  Administered 2014-05-13: 22:00:00 via INTRAVENOUS

## 2014-05-13 NOTE — MAU Provider Note (Signed)
History     CSN: [id number]  Arrival date and time: 05/13/14 1352   First Provider Initiated Contact with Patient 05/13/14 1454      Chief Complaint  Patient presents with  . Abdominal Pain   HPI  Ms. Kelly [name] is a 33 y.o. G1P0 at [redacted]w[redacted]d who presents to MAU today with complaint of severe sharp abdominal pain with acute onset this afternoon. The patient states that pain is at the level of the umbilicus and below. She states that the pain and pressure feels lower with standing. Pain is worse with ambulation and change or positions. She states sharp shooting pains bilaterally. She denies contractions, vaginal bleeding, discharge, LOF, UTI symptoms, N/V/D or fever. She denies complications with this pregnancy. She reports good fetal movement.   OB History    Gravida Para Term Preterm AB TAB SAB Ectopic Multiple Living   1               Past Medical History  Diagnosis Date  . Anxiety   . Osteonecrosis   . Asthma     exercise induced    Past Surgical History  Procedure Laterality Date  . Joint replacement      bilateral knees  . Wisdom tooth extraction      History reviewed. No pertinent family history.  History  Substance Use Topics  . Smoking status: Never Smoker   . Smokeless tobacco: Never Used  . Alcohol Use: No    Allergies:  Allergies  Allergen Reactions  . Prednisone Other (See Comments)    unknown    Prescriptions prior to admission  Medication Sig Dispense Refill Last Dose  . acetaminophen (TYLENOL) 500 MG tablet Take 1,000 mg by mouth every 6 (six) hours as needed for mild pain.   05/12/2014 at Unknown time  . calcium carbonate (TUMS - DOSED IN MG ELEMENTAL CALCIUM) 500 MG chewable tablet Chew 2 tablets by mouth 3 (three) times daily as needed for indigestion or heartburn.   05/13/2014 at Unknown time  . Prenatal Vit-Fe Fumarate-FA (PRENATAL MULTIVITAMIN) TABS tablet Take 1 tablet by mouth daily at 12 noon.   05/13/2014 at Unknown time  . amLODipine  (NORVASC) 10 MG tablet Take 1 tablet (10 mg total) by mouth daily. (Patient not taking: Reported on 05/13/2014) 30 tablet 0 Taking  . amphetamine-dextroamphetamine (ADDERALL XR) 25 MG 24 hr capsule Take 25 mg by mouth 2 (two) times daily.   Taking  . clonazePAM (KLONOPIN) 1 MG tablet Take 1 mg by mouth as needed.   Taking  . fish oil-omega-3 fatty acids 1000 MG capsule Take 500 mg by mouth 3 (three) times a week.   Taking  . lamoTRIgine (LAMICTAL) 25 MG tablet Take 25 mg by mouth daily.   Not Taking  . omeprazole (PRILOSEC) 40 MG capsule Take 1 capsule (40 mg total) by mouth daily. 30 capsule 3 Not Taking  . oxyCODONE-acetaminophen (PERCOCET) 5-325 MG per tablet Take 1 tablet by mouth every 4 (four) hours as needed.   Taking  . venlafaxine (EFFEXOR) 37.5 MG tablet Take 37.5 mg by mouth 2 (two) times daily.       Review of Systems  Constitutional: Negative for fever and malaise/fatigue.  Gastrointestinal: Positive for heartburn, nausea and abdominal pain. Negative for vomiting, diarrhea and constipation.  Genitourinary: Negative for dysuria, urgency and frequency.       Neg - vaginal bleeding, discharge, LOF   Physical Exam   Blood pressure 120/59, pulse 75, temperature 98  F (36.7 C), temperature source Oral, resp. rate 20, last menstrual period 09/16/2013.  Physical Exam  Constitutional: She is oriented to person, place, and time. She appears well-developed and well-nourished. No distress.  HENT:  Head: Normocephalic.  Cardiovascular: Normal rate.   Respiratory: Effort normal.  GI: Soft. Bowel sounds are normal. She exhibits no distension and no mass. There is tenderness (moderate tenderness to palpation of the lower abdomen bilaterally). There is no rebound and no guarding.  Neurological: She is alert and oriented to person, place, and time.  Skin: Skin is warm and dry. No erythema.  Psychiatric: She has a normal mood and affect.  Dilation: Closed Effacement (%): Thick Cervical  Position: Posterior Exam by:: Harlon Flor PA  Results for orders placed or performed during the hospital encounter of 05/13/14 (from the past 24 hour(s))  Urinalysis, Routine w reflex microscopic     Status: Abnormal   Collection Time: 05/13/14  2:25 PM  Result Value Ref Range   Color, Urine YELLOW YELLOW   APPearance CLOUDY (A) CLEAR   Specific Gravity, Urine 1.010 1.005 - 1.030   pH 7.5 5.0 - 8.0   Glucose, UA NEGATIVE NEGATIVE mg/dL   Hgb urine dipstick NEGATIVE NEGATIVE   Bilirubin Urine NEGATIVE NEGATIVE   Ketones, ur NEGATIVE NEGATIVE mg/dL   Protein, ur NEGATIVE NEGATIVE mg/dL   Urobilinogen, UA 0.2 0.0 - 1.0 mg/dL   Nitrite NEGATIVE NEGATIVE   Leukocytes, UA TRACE (A) NEGATIVE  Urine microscopic-add on     Status: Abnormal   Collection Time: 05/13/14  2:25 PM  Result Value Ref Range   Squamous Epithelial / LPF MANY (A) RARE   WBC, UA 0-2 <3 WBC/hpf   Bacteria, UA MANY (A) RARE  CBC with Differential     Status: Abnormal   Collection Time: 05/13/14  3:55 PM  Result Value Ref Range   WBC 19.3 (H) 4.0 - 10.5 K/uL   RBC 3.91 3.87 - 5.11 MIL/uL   Hemoglobin 11.7 (L) 12.0 - 15.0 g/dL   HCT 40.9 (L) 81.1 - 91.4 %   MCV 86.2 78.0 - 100.0 fL   MCH 29.9 26.0 - 34.0 pg   MCHC 34.7 30.0 - 36.0 g/dL   RDW 78.2 95.6 - 21.3 %   Platelets 300 150 - 400 K/uL   Neutrophils Relative % 84 (H) 43 - 77 %   Neutro Abs 16.3 (H) 1.7 - 7.7 K/uL   Lymphocytes Relative 10 (L) 12 - 46 %   Lymphs Abs 2.0 0.7 - 4.0 K/uL   Monocytes Relative 5 3 - 12 %   Monocytes Absolute 1.0 0.1 - 1.0 K/uL   Eosinophils Relative 1 0 - 5 %   Eosinophils Absolute 0.1 0.0 - 0.7 K/uL   Basophils Relative 0 0 - 1 %   Basophils Absolute 0.0 0.0 - 0.1 K/uL   Mr Pelvis Wo Contrast  05/13/2014   CLINICAL DATA:  Sharp stabbing pain in the right lower quadrant started this morning. Patient is [redacted] weeks pregnant  EXAM: MRI ABDOMEN AND PELVIS WITHOUT CONTRAST  TECHNIQUE: Multiplanar multisequence MR imaging of the abdomen  and pelvis was performed. No intravenous contrast was administered.  COMPARISON:  None.  FINDINGS: MRI ABDOMEN FINDINGS  Lung bases are clear. The gallbladder is normal. No significant hydronephrosis. The right renal pelvis is slightly full compared to the left. Pancreas is normal. The spleen appears normal. No fluid the upper abdomen.  MRI PELVIS FINDINGS  The appendix is not clearly identified  in the right lower quadrant; however there is no periappendiceal fluid or inflammation suggest acute appendicitis. A tubular structure along the uterine fundus may represent normal appendix seen on coronal image 16, series 7. The small bowel and colon are grossly normal. No free fluid in the pelvis. Gravity uterus noted. Cephalic orientation the fetus with posterior placenta.  IMPRESSION: 1. While the appendix is not clearly identified, there are no secondary signs of acute appendicitis. 2. No significant hydronephrosis. 3. The gallbladder is normal. 4. Gravid uterus.   Electronically Signed   By: Genevive BiStewart  Edmunds M.D.   On: 05/13/2014 19:40   Mr Abdomen Wo Contrast  05/13/2014   CLINICAL DATA:  Sharp stabbing pain in the right lower quadrant started this morning. Patient is [redacted] weeks pregnant  EXAM: MRI ABDOMEN AND PELVIS WITHOUT CONTRAST  TECHNIQUE: Multiplanar multisequence MR imaging of the abdomen and pelvis was performed. No intravenous contrast was administered.  COMPARISON:  None.  FINDINGS: MRI ABDOMEN FINDINGS  Lung bases are clear. The gallbladder is normal. No significant hydronephrosis. The right renal pelvis is slightly full compared to the left. Pancreas is normal. The spleen appears normal. No fluid the upper abdomen.  MRI PELVIS FINDINGS  The appendix is not clearly identified in the right lower quadrant; however there is no periappendiceal fluid or inflammation suggest acute appendicitis. A tubular structure along the uterine fundus may represent normal appendix seen on coronal image 16, series 7. The  small bowel and colon are grossly normal. No free fluid in the pelvis. Gravity uterus noted. Cephalic orientation the fetus with posterior placenta.  IMPRESSION: 1. While the appendix is not clearly identified, there are no secondary signs of acute appendicitis. 2. No significant hydronephrosis. 3. The gallbladder is normal. 4. Gravid uterus.   Electronically Signed   By: Genevive BiStewart  Edmunds M.D.   On: 05/13/2014 19:40   [place] Ob Limited  05/13/2014   OBSTETRICAL ULTRASOUND: This exam was performed within a [facility]. The OB US report was generated in the [facility] system, and faxed to the ordering physician.   This report is available in the [facility] PACS. See the [facility] Obstetric US report via the Image Link.  [place] Mfm Ob Transvaginal  05/13/2014   OBSTETRICAL ULTRASOUND: This exam was performed within a [facility]. The OB US report was generated in the AS system, and faxed to the ordering physician.   This report is available in the [facility] [employer] PACS. See the [facility] Obstetric US report via the Image Link.   Fetal Monitoring: Baseline: 120 bpm, moderate variability, + accelerations, no decelerations Contractions: none  MAU Course  Procedures None  MDM UA today Urine culture pending Discussed with Dr. Rana SnareLowe. CBC and US today.  OB US shows normal AFI, cervical length and placenta.  Patient given 1 mg Dilaudid in MAU. Reports significant improvement in pain.  Just prior to transport for imaging patient requests additional pain medication. 1 mg Dilaudid IM given.  Discussed leukocytosis with Dr. Rana SnareLowe. MRI to evaluate for possible appendicitis MRI without contrast of the abdomen and pelvis ordered. [facility].  2000 - [facility]. Care turned over to Venia CarbonJennifer Rasch, NP Patient returned from MRI> no sign of appendicitis> patient continues to experience pain in her abdomen. Dr. Rana SnareLowe notified of MRI  results and patients pain. I will given 1 mg of stadol, 25 mg of phenergan and treat UTI with 1 gram of rocephin. If  pain improves, will send the patient home to return if symptoms worsen. Patient denies back pain . Patient rates her pain 0/10 at the time of discharge.    Marny LowensteinJulie N Wenzel, PA-C  05/13/2014, 7:59 PM  Assessment and Plan    A: Abdominal pain in pregnancy  UTI Leukocytosis   P: Discharge home in stable condition Return to MAU if symptoms worsen Keep follow up appointment with OB  Iona HansenJennifer Irene Rasch, NP 05/14/2014 1:09 AM

## 2014-05-13 NOTE — Discharge Instructions (Signed)

## 2014-05-13 NOTE — MAU Note (Signed)
Denies urinary symptoms or GI problems

## 2014-05-13 NOTE — MAU Note (Signed)
[facility] informed of transport to [facility].

## 2014-05-13 NOTE — MAU Note (Signed)
A little over an hour ago, started having sharp pain in abd, sometimes it feels like she is twitching or moving really fast.  Causes burning sharp pains, pain is around belly button; when she walks it feels a little lower.Kelly [name]. Broke out in a sweat.

## 2014-05-16 LAB — CULTURE, OB URINE

## 2014-05-18 ENCOUNTER — Encounter: Payer: Self-pay | Admitting: Family

## 2014-05-18 DIAGNOSIS — B951 Streptococcus, group B, as the cause of diseases classified elsewhere: Secondary | ICD-10-CM | POA: Insufficient documentation

## 2014-07-06 ENCOUNTER — Inpatient Hospital Stay (HOSPITAL_COMMUNITY)
Admission: AD | Admit: 2014-07-06 | Discharge: 2014-07-06 | Disposition: A | Discharge status: Home / Self Care | Payer: BC Managed Care – PPO | Source: Ambulatory Visit | Attending: Obstetrics & Gynecology | Admitting: Obstetrics & Gynecology

## 2014-07-06 ENCOUNTER — Inpatient Hospital Stay (HOSPITAL_COMMUNITY): Payer: BC Managed Care – PPO

## 2014-07-06 ENCOUNTER — Encounter (HOSPITAL_COMMUNITY): Payer: Self-pay | Admitting: *Deleted

## 2014-07-06 DIAGNOSIS — R51 Headache: Secondary | ICD-10-CM | POA: Insufficient documentation

## 2014-07-06 DIAGNOSIS — Z3689 Encounter for other specified antenatal screening: Secondary | ICD-10-CM | POA: Insufficient documentation

## 2014-07-06 DIAGNOSIS — Z3A35 35 weeks gestation of pregnancy: Secondary | ICD-10-CM | POA: Diagnosis not present

## 2014-07-06 DIAGNOSIS — O133 Gestational [pregnancy-induced] hypertension without significant proteinuria, third trimester: Secondary | ICD-10-CM | POA: Insufficient documentation

## 2014-07-06 DIAGNOSIS — O99820 Streptococcus B carrier state complicating pregnancy: Secondary | ICD-10-CM | POA: Insufficient documentation

## 2014-07-06 DIAGNOSIS — O26893 Other specified pregnancy related conditions, third trimester: Secondary | ICD-10-CM

## 2014-07-06 DIAGNOSIS — R109 Unspecified abdominal pain: Secondary | ICD-10-CM | POA: Diagnosis present

## 2014-07-06 DIAGNOSIS — R519 Headache, unspecified: Secondary | ICD-10-CM

## 2014-07-06 DIAGNOSIS — B951 Streptococcus, group B, as the cause of diseases classified elsewhere: Secondary | ICD-10-CM

## 2014-07-06 HISTORY — DX: Other specified behavioral and emotional disorders with onset usually occurring in childhood and adolescence: F98.8

## 2014-07-06 LAB — PROTEIN / CREATININE RATIO, URINE
Creatinine, Urine: 28 mg/dL
Total Protein, Urine: <6 mg/dL

## 2014-07-06 LAB — CBC
HCT: 35.7 % — ABNORMAL LOW (ref 36.0–46.0)
HEMOGLOBIN: 12.1 g/dL (ref 12.0–15.0)
MCH: 29.5 pg (ref 26.0–34.0)
MCHC: 33.9 g/dL (ref 30.0–36.0)
MCV: 87.1 fL (ref 78.0–100.0)
PLATELETS: 300 10*3/uL (ref 150–400)
RBC: 4.1 MIL/uL (ref 3.87–5.11)
RDW: 12.8 % (ref 11.5–15.5)
WBC: 16.8 10*3/uL — AB (ref 4.0–10.5)

## 2014-07-06 LAB — URINALYSIS, ROUTINE W REFLEX MICROSCOPIC
BILIRUBIN URINE: NEGATIVE
GLUCOSE, UA: NEGATIVE mg/dL
HGB URINE DIPSTICK: NEGATIVE
KETONES UR: NEGATIVE mg/dL
Leukocytes, UA: NEGATIVE
Nitrite: NEGATIVE
PROTEIN: NEGATIVE mg/dL
Specific Gravity, Urine: 1.015 (ref 1.005–1.030)
Urobilinogen, UA: 0.2 mg/dL (ref 0.0–1.0)
pH: 7.5 (ref 5.0–8.0)

## 2014-07-06 LAB — COMPREHENSIVE METABOLIC PANEL
ALK PHOS: 124 U/L — AB (ref 39–117)
ALT: 13 U/L (ref 0–35)
AST: 19 U/L (ref 0–37)
Albumin: 3.1 g/dL — ABNORMAL LOW (ref 3.5–5.2)
Anion gap: 7 (ref 5–15)
BILIRUBIN TOTAL: 0.5 mg/dL (ref 0.3–1.2)
BUN: 11 mg/dL (ref 6–23)
CALCIUM: 9.4 mg/dL (ref 8.4–10.5)
CHLORIDE: 107 meq/L (ref 96–112)
CO2: 25 mmol/L (ref 19–32)
Creatinine, Ser: 0.61 mg/dL (ref 0.50–1.10)
GFR calc Af Amer: >90 mL/min (ref >90)
GFR calc non Af Amer: >90 mL/min (ref >90)
Glucose, Bld: 84 mg/dL (ref 70–99)
POTASSIUM: 5.1 mmol/L (ref 3.5–5.1)
SODIUM: 139 mmol/L (ref 135–145)
Total Protein: 6.9 g/dL (ref 6.0–8.3)

## 2014-07-06 LAB — URIC ACID: Uric Acid, Serum: 4.5 mg/dL (ref 2.4–7.0)

## 2014-07-06 LAB — LACTATE DEHYDROGENASE: LDH: 181 U/L (ref 94–250)

## 2014-07-06 MED ORDER — HYDROCODONE-ACETAMINOPHEN 5-325 MG PO TABS: 2.0000 | ORAL_TABLET | Freq: Four times a day (QID) | ORAL | Status: DC | PRN

## 2014-07-06 MED ORDER — HYDROCODONE-ACETAMINOPHEN 5-325 MG PO TABS: 1.0000 | ORAL_TABLET | Freq: Four times a day (QID) | ORAL | Status: DC | PRN

## 2014-07-06 NOTE — MAU Note (Signed)
Patient states she was in the office this am for a regular appointment and her blood pressure was elevated. Has been having headaches and blurred vision. Having upper abdominal pain and soreness. Having some contractions, no bleeding or leaking but having a slightly heavier vaginal discharge. Reports good fetal movement.

## 2014-07-06 NOTE — Discharge Instructions (Signed)

## 2014-07-06 NOTE — MAU Provider Note (Signed)
Chief Complaint:  Hypertension; Headache; and Abdominal Pain   First Provider Initiated Contact with Patient 07/06/14 1234      HPI: Kelly [name] is a 33 y.o. G1P0 at 3128w5d who presents to maternity admissions sent from the office for elevated BP, headaches x 2 weeks, and upper abdominal pain which is intermittent.  She reports her headaches are sometimes mild and resolved by Tylenol but sometimes more severe and not resolved easily.  Today, she has not taken any medication for her headache and reports it is worsening since this morning but not severe, right frontal, not associated with n/v or light sensitivity.  She reports good fetal movement, denies LOF, vaginal bleeding, vaginal itching/burning, urinary symptoms, dizziness, n/v, or fever/chills.    Past Medical History: Past Medical History  Diagnosis Date  . Anxiety   . Osteonecrosis   . Asthma     exercise induced    Past obstetric history: OB History  Gravida Para Term Preterm AB SAB TAB Ectopic Multiple Living  1             # Outcome Date GA Lbr Len/2nd Weight Sex Delivery Anes PTL Lv  1 Current               Past Surgical History: Past Surgical History  Procedure Laterality Date  . Joint replacement      bilateral knees  . Wisdom tooth extraction      Family History: History reviewed. No pertinent family history.  Social History: History  Substance Use Topics  . Smoking status: Never Smoker   . Smokeless tobacco: Never Used  . Alcohol Use: No    Allergies:  Allergies  Allergen Reactions  . Prednisone Other (See Comments)    unknown    Meds:  Prescriptions prior to admission  Medication Sig Dispense Refill Last Dose  . acetaminophen (TYLENOL) 500 MG tablet Take 1,000 mg by mouth every 6 (six) hours as needed for mild pain.   07/05/2014 at Unknown time  . famotidine (PEPCID) 20 MG tablet Take 20 mg by mouth daily.   07/06/2014 at Unknown time  . amLODipine (NORVASC) 10 MG tablet Take 1 tablet (10 mg  total) by mouth daily. (Patient not taking: Reported on 07/06/2014) 30 tablet 0 Taking  . omeprazole (PRILOSEC) 40 MG capsule Take 1 capsule (40 mg total) by mouth daily. 30 capsule 3 Not Taking    ROS: Pertinent findings in history of present illness.  Physical Exam  Blood pressure 139/90, pulse 87, temperature 98.6 F (37 C), temperature source Oral, resp. rate 16, height 5' 4.5" (1.638 m), weight 78.2 kg (172 lb 6.4 oz), last menstrual period 09/16/2013, SpO2 98 %.  Patient Vitals for the past 24 hrs:  BP Temp Temp src Pulse Resp SpO2 Height Weight  07/06/14 1302 139/90 mmHg - - 87 - - - -  07/06/14 1252 139/89 mmHg - - 89 - - - -  07/06/14 1242 134/90 mmHg - - 99 - - - -  07/06/14 1232 127/76 mmHg - - 83 - - - -  07/06/14 1223 130/85 mmHg - - 87 - - - -  07/06/14 1216 134/90 mmHg - - 91 - - - -  07/06/14 1154 145/80 mmHg 98.6 F (37 C) Oral 93 16 98 % 5' 4.5" (1.638 m) 78.2 kg (172 lb 6.4 oz)   GENERAL: Well-developed, well-nourished female in no acute distress.  HEENT: normocephalic HEART: normal rate RESP: normal effort ABDOMEN: Soft, non-tender, gravid appropriate for  gestational age EXTREMITIES: Nontender, no edema NEURO: alert and oriented    FHT:  Baseline 135, moderate variability, accelerations present, no decelerations Contractions: None on toco or to palpation   Labs: Results for orders placed or performed during the hospital encounter of 07/06/14 (from the past 24 hour(s))  Urinalysis, Routine w reflex microscopic     Status: None   Collection Time: 07/06/14 12:01 PM  Result Value Ref Range   Color, Urine YELLOW YELLOW   APPearance CLEAR CLEAR   Specific Gravity, Urine 1.015 1.005 - 1.030   pH 7.5 5.0 - 8.0   Glucose, UA NEGATIVE NEGATIVE mg/dL   Hgb urine dipstick NEGATIVE NEGATIVE   Bilirubin Urine NEGATIVE NEGATIVE   Ketones, ur NEGATIVE NEGATIVE mg/dL   Protein, ur NEGATIVE NEGATIVE mg/dL   Urobilinogen, UA 0.2 0.0 - 1.0 mg/dL   Nitrite NEGATIVE  NEGATIVE   Leukocytes, UA NEGATIVE NEGATIVE  Protein / creatinine ratio, urine     Status: None   Collection Time: 07/06/14 12:01 PM  Result Value Ref Range   Creatinine, Urine 28.00 mg/dL   Total Protein, Urine <6 mg/dL   Protein Creatinine Ratio        0.00 - 0.15  CBC     Status: Abnormal   Collection Time: 07/06/14 12:35 PM  Result Value Ref Range   WBC 16.8 (H) 4.0 - 10.5 K/uL   RBC 4.10 3.87 - 5.11 MIL/uL   Hemoglobin 12.1 12.0 - 15.0 g/dL   HCT 09.835.7 (L) 11.936.0 - 14.746.0 %   MCV 87.1 78.0 - 100.0 fL   MCH 29.5 26.0 - 34.0 pg   MCHC 33.9 30.0 - 36.0 g/dL   RDW 82.912.8 56.211.5 - 13.015.5 %   Platelets 300 150 - 400 K/uL  Comprehensive metabolic panel     Status: Abnormal   Collection Time: 07/06/14 12:35 PM  Result Value Ref Range   Sodium 139 135 - 145 mmol/L   Potassium 5.1 3.5 - 5.1 mmol/L   Chloride 107 96 - 112 mEq/L   CO2 25 19 - 32 mmol/L   Glucose, Bld 84 70 - 99 mg/dL   BUN 11 6 - 23 mg/dL   Creatinine, Ser 8.650.61 0.50 - 1.10 mg/dL   Calcium 9.4 8.4 - 78.410.5 mg/dL   Total Protein 6.9 6.0 - 8.3 g/dL   Albumin 3.1 (L) 3.5 - 5.2 g/dL   AST 19 0 - 37 U/L   ALT 13 0 - 35 U/L   Alkaline Phosphatase 124 (H) 39 - 117 U/L   Total Bilirubin 0.5 0.3 - 1.2 mg/dL   GFR calc non Af Amer >90 >90 mL/min   GFR calc Af Amer >90 >90 mL/min   Anion gap 7 5 - 15  Uric acid     Status: None   Collection Time: 07/06/14 12:35 PM  Result Value Ref Range   Uric Acid, Serum 4.5 2.4 - 7.0 mg/dL  Lactate dehydrogenase     Status: None   Collection Time: 07/06/14 12:35 PM  Result Value Ref Range   LDH 181 94 - 250 U/L    Imaging:  Preliminary report with normal AFI, EFW 24%tile  ED Course CBC, CMP, uric acid, LDH, U/A, and protein/creatinine ratio OB follow up U/S for growth  Assessment: 1. Group beta Strep positive   2. Gestational hypertension w/o significant proteinuria in 3rd trimester   3. Headache in pregnancy, antepartum, third trimester     Plan: Consult Dr Langston MaskerMorris D/C home with  preeclampsia  precautions F/U in office on Monday Return to MAU as needed      Follow-up Information    Follow up with [facility] Cristie Hem, MD.   Specialty:  Obstetrics and Gynecology   Why:  As scheduled   Contact information:   [address] [place] [address] [phone]       Follow up with [facility] [place] MATERNITY ADMISSIONS.   Why:  As needed for emergencies   Contact information:   [address] [id number] mc [place] [address] [phone]       Medication List    STOP taking these medications        amLODipine 10 MG tablet  Commonly known as:  NORVASC      TAKE these medications        acetaminophen 500 MG tablet  Commonly known as:  TYLENOL  Take 1,000 mg by mouth every 6 (six) hours as needed for mild pain.     famotidine 20 MG tablet  Commonly known as:  PEPCID  Take 20 mg by mouth daily.     HYDROcodone-acetaminophen 5-325 MG per tablet  Commonly known as:  NORCO/VICODIN  Take 2 tablets by mouth every 6 (six) hours as needed for moderate pain or severe pain.     omeprazole 40 MG capsule  Commonly known as:  PRILOSEC  Take 1 capsule (40 mg total) by mouth daily.        Sharen Counter Certified Nurse-Midwife 07/06/2014 3:20 PM

## 2014-07-09 ENCOUNTER — Inpatient Hospital Stay (HOSPITAL_COMMUNITY)
Admission: AD | Admit: 2014-07-09 | Discharge: 2014-07-10 | Disposition: A | Discharge status: Home / Self Care | Payer: BLUE CROSS/BLUE SHIELD | Source: Ambulatory Visit | Attending: Obstetrics and Gynecology | Admitting: Obstetrics and Gynecology

## 2014-07-09 ENCOUNTER — Encounter (HOSPITAL_COMMUNITY): Payer: Self-pay

## 2014-07-09 DIAGNOSIS — R51 Headache: Secondary | ICD-10-CM | POA: Diagnosis present

## 2014-07-09 DIAGNOSIS — R109 Unspecified abdominal pain: Secondary | ICD-10-CM

## 2014-07-09 DIAGNOSIS — O9989 Other specified diseases and conditions complicating pregnancy, childbirth and the puerperium: Secondary | ICD-10-CM | POA: Diagnosis not present

## 2014-07-09 DIAGNOSIS — Z3A36 36 weeks gestation of pregnancy: Secondary | ICD-10-CM | POA: Insufficient documentation

## 2014-07-09 DIAGNOSIS — H538 Other visual disturbances: Secondary | ICD-10-CM | POA: Insufficient documentation

## 2014-07-09 DIAGNOSIS — R1011 Right upper quadrant pain: Secondary | ICD-10-CM | POA: Diagnosis not present

## 2014-07-09 DIAGNOSIS — O26899 Other specified pregnancy related conditions, unspecified trimester: Secondary | ICD-10-CM | POA: Insufficient documentation

## 2014-07-09 DIAGNOSIS — R519 Headache, unspecified: Secondary | ICD-10-CM

## 2014-07-09 DIAGNOSIS — O133 Gestational [pregnancy-induced] hypertension without significant proteinuria, third trimester: Secondary | ICD-10-CM | POA: Diagnosis not present

## 2014-07-09 LAB — COMPREHENSIVE METABOLIC PANEL
ALT: 12 U/L (ref 0–35)
ANION GAP: 6 (ref 5–15)
AST: 18 U/L (ref 0–37)
Albumin: 3.1 g/dL — ABNORMAL LOW (ref 3.5–5.2)
Alkaline Phosphatase: 128 U/L — ABNORMAL HIGH (ref 39–117)
BUN: 9 mg/dL (ref 6–23)
CALCIUM: 9 mg/dL (ref 8.4–10.5)
CO2: 25 mmol/L (ref 19–32)
Chloride: 107 mEq/L (ref 96–112)
Creatinine, Ser: 0.63 mg/dL (ref 0.50–1.10)
GFR calc non Af Amer: >90 mL/min (ref >90)
GLUCOSE: 80 mg/dL (ref 70–99)
Potassium: 3.8 mmol/L (ref 3.5–5.1)
Sodium: 138 mmol/L (ref 135–145)
Total Bilirubin: 0.5 mg/dL (ref 0.3–1.2)
Total Protein: 6.4 g/dL (ref 6.0–8.3)

## 2014-07-09 LAB — URINALYSIS, ROUTINE W REFLEX MICROSCOPIC
BILIRUBIN URINE: NEGATIVE
Glucose, UA: NEGATIVE mg/dL
HGB URINE DIPSTICK: NEGATIVE
KETONES UR: NEGATIVE mg/dL
Leukocytes, UA: NEGATIVE
NITRITE: NEGATIVE
PROTEIN: NEGATIVE mg/dL
SPECIFIC GRAVITY, URINE: 1.02 (ref 1.005–1.030)
UROBILINOGEN UA: 0.2 mg/dL (ref 0.0–1.0)
pH: 5.5 (ref 5.0–8.0)

## 2014-07-09 LAB — CBC
HEMATOCRIT: 36 % (ref 36.0–46.0)
HEMOGLOBIN: 12.1 g/dL (ref 12.0–15.0)
MCH: 28.9 pg (ref 26.0–34.0)
MCHC: 33.6 g/dL (ref 30.0–36.0)
MCV: 85.9 fL (ref 78.0–100.0)
Platelets: 293 10*3/uL (ref 150–400)
RBC: 4.19 MIL/uL (ref 3.87–5.11)
RDW: 13 % (ref 11.5–15.5)
WBC: 16.6 10*3/uL — AB (ref 4.0–10.5)

## 2014-07-09 LAB — PROTEIN / CREATININE RATIO, URINE
Creatinine, Urine: 111 mg/dL
Protein Creatinine Ratio: 0.14 (ref 0.00–0.15)
Total Protein, Urine: 15 mg/dL

## 2014-07-09 MED ORDER — OXYCODONE-ACETAMINOPHEN 5-325 MG PO TABS
2.0000 | ORAL_TABLET | Freq: Once | ORAL | Status: AC
  Administered 2014-07-10: 2 via ORAL
  Filled 2014-07-09: qty 2

## 2014-07-09 NOTE — MAU Note (Signed)
Pt presents complaining of a headache, blurred vision, and right upper quadrant pain since this am. States the baby has not been moving as much today. Denies vaginal bleeding or discharge.

## 2014-07-10 ENCOUNTER — Inpatient Hospital Stay (HOSPITAL_COMMUNITY): Payer: BLUE CROSS/BLUE SHIELD

## 2014-07-10 DIAGNOSIS — O133 Gestational [pregnancy-induced] hypertension without significant proteinuria, third trimester: Secondary | ICD-10-CM

## 2014-07-10 DIAGNOSIS — Z3A36 36 weeks gestation of pregnancy: Secondary | ICD-10-CM | POA: Diagnosis not present

## 2014-07-10 DIAGNOSIS — R109 Unspecified abdominal pain: Secondary | ICD-10-CM

## 2014-07-10 DIAGNOSIS — O26899 Other specified pregnancy related conditions, unspecified trimester: Secondary | ICD-10-CM | POA: Insufficient documentation

## 2014-07-10 NOTE — Progress Notes (Addendum)
34 yo G1P0 @ 36 2/7 weeks with right frontal headache for several weeks. Over last 2 weeks headache worse, waxing and waning, behind right eye. Has some blurry vision and has photophobia. Rates HA 4/10 intensity. Was given vicodin on 07/06/14 for HA but has not taken any. No meds for HA today. Also has had epigastric pain and tenderness since about November. Tonight C/O a sharp, waxing and waning sharp sharp pain over right side of uterus for about 2 weeks. Has had some vaginal D/C but no ROM. Had a small red spot x 1 yesterday but none since. Baby moving less this pm. No trauma, no falls. Hx of CHTN on Norvasc until about 1.5 years ago. No BP meds now.  Filed Vitals:   07/09/14 2327  BP: 125/90  Pulse: 92  Temp:   Resp:    BP labile  156/105, 150/94, 140/83, 142/97, 143/91, 112/67, 125/90  Patient in NAD Pupils and smile symmetric Lungs CTA Cor RRR Abd mild epigastric tenderness, tender over right side of uterine fundus, uterus is soft and otherwise NT  Results for orders placed or performed during the hospital encounter of 07/09/14 (from the past 72 hour(s))  Urinalysis, Routine w reflex microscopic     Status: None   Collection Time: 07/09/14  9:44 PM  Result Value Ref Range   Color, Urine YELLOW YELLOW   APPearance CLEAR CLEAR   Specific Gravity, Urine 1.020 1.005 - 1.030   pH 5.5 5.0 - 8.0   Glucose, UA NEGATIVE NEGATIVE mg/dL   Hgb urine dipstick NEGATIVE NEGATIVE   Bilirubin Urine NEGATIVE NEGATIVE   Ketones, ur NEGATIVE NEGATIVE mg/dL   Protein, ur NEGATIVE NEGATIVE mg/dL   Urobilinogen, UA 0.2 0.0 - 1.0 mg/dL   Nitrite NEGATIVE NEGATIVE   Leukocytes, UA NEGATIVE NEGATIVE    Comment: MICROSCOPIC NOT DONE ON URINES WITH NEGATIVE PROTEIN, BLOOD, LEUKOCYTES, NITRITE, OR GLUCOSE <1000 mg/dL.  Protein / creatinine ratio, urine     Status: None   Collection Time: 07/09/14  9:44 PM  Result Value Ref Range   Creatinine, Urine 111.00 mg/dL   Total Protein, Urine 15 mg/dL     Comment: NO NORMAL RANGE ESTABLISHED FOR THIS TEST   Protein Creatinine Ratio 0.14 0.00 - 0.15  CBC     Status: Abnormal   Collection Time: 07/09/14  9:55 PM  Result Value Ref Range   WBC 16.6 (H) 4.0 - 10.5 K/uL   RBC 4.19 3.87 - 5.11 MIL/uL   Hemoglobin 12.1 12.0 - 15.0 g/dL   HCT 36.0 36.0 - 46.0 %   MCV 85.9 78.0 - 100.0 fL   MCH 28.9 26.0 - 34.0 pg   MCHC 33.6 30.0 - 36.0 g/dL   RDW 13.0 11.5 - 15.5 %   Platelets 293 150 - 400 K/uL  Comprehensive metabolic panel     Status: Abnormal   Collection Time: 07/09/14  9:55 PM  Result Value Ref Range   Sodium 138 135 - 145 mmol/L    Comment: Please note change in reference range.   Potassium 3.8 3.5 - 5.1 mmol/L    Comment: Please note change in reference range. DELTA CHECK NOTED REPEATED TO VERIFY    Chloride 107 96 - 112 mEq/L   CO2 25 19 - 32 mmol/L   Glucose, Bld 80 70 - 99 mg/dL   BUN 9 6 - 23 mg/dL   Creatinine, Ser 0.63 0.50 - 1.10 mg/dL   Calcium 9.0 8.4 - 10.5 mg/dL   Total  Protein 6.4 6.0 - 8.3 g/dL   Albumin 3.1 (L) 3.5 - 5.2 g/dL   AST 18 0 - 37 U/L   ALT 12 0 - 35 U/L   Alkaline Phosphatase 128 (H) 39 - 117 U/L   Total Bilirubin 0.5 0.3 - 1.2 mg/dL   GFR calc non Af Amer >90 >90 mL/min   GFR calc Af Amer >90 >90 mL/min    Comment: (NOTE) The eGFR has been calculated using the CKD EPI equation. This calculation has not been validated in all clinical situations. eGFR's persistently <90 mL/min signify possible Chronic Kidney Disease.    Anion gap 6 5 - 62   FHT initially NR, now accels UCs UI  A/P: labile BP with normal labs unchanged from 07/06/14         HA-lateralized, for several weeks but now more severe         Uterine pain and tenderness x about 2 weeks-U/S on 07/06/14 normal         Will give Percocet #2 now          U/S with BPP and will check placenta (posterior) - D/W U/S tech          Head CT-D/W radiologist  ADDENDUM:  Patient Vitals for the past 24 hrs:  BP Temp Temp src Pulse Resp   07/10/14 0337 126/82 mmHg - - 71 -  07/10/14 0158 129/86 mmHg - - 70 -  07/09/14 2357 139/83 mmHg - - 74 -  07/09/14 2342 141/90 mmHg - - 78 -   BPP 8/8 FI 11.3 cm  Ct Head Wo Contrast  07/10/2014   CLINICAL DATA:  Right frontal headache for 2 weeks. Pregnant. Patient was shielded.  EXAM: CT HEAD WITHOUT CONTRAST  TECHNIQUE: Contiguous axial images were obtained from the base of the skull through the vertex without intravenous contrast.  COMPARISON:  None.  FINDINGS: Ventricles and sulci appear symmetrical. No mass effect or midline shift. No abnormal extra-axial fluid collections. Gray-white matter junctions are distinct. Basal cisterns are not effaced. No evidence of acute intracranial hemorrhage. No depressed skull fractures. Visualized paranasal sinuses and mastoid air cells are not opacified.  IMPRESSION: No acute intracranial abnormalities.  Normal examination.   Electronically Signed   By: Lucienne Capers M.D.   On: 07/10/2014 02:26   US Ob Comp + 14 Wk  07/06/2014   OBSTETRICAL ULTRASOUND: This exam was performed within a [facility]. The OB US report was generated in the [facility] system, and faxed to the ordering physician.   This report is available in the [employer]. See the [facility] Obstetric US report via the Image Link.   1. Gestational hypertension without significant proteinuria in third trimester   2. Abdominal pain affecting pregnancy   3. Headache    D/C home per consult w/ Dr. Gaetano Net.   Manya Silvas, CNM 07/10/2014 8:00 AM

## 2014-07-10 NOTE — Discharge Instructions (Signed)

## 2014-07-13 ENCOUNTER — Encounter (HOSPITAL_COMMUNITY): Payer: Self-pay | Admitting: *Deleted

## 2014-07-13 ENCOUNTER — Inpatient Hospital Stay (HOSPITAL_COMMUNITY)
Admission: AD | Admit: 2014-07-13 | Discharge: 2014-07-13 | Disposition: A | Discharge status: Home / Self Care | Payer: BLUE CROSS/BLUE SHIELD | Source: Ambulatory Visit | Attending: Obstetrics and Gynecology | Admitting: Obstetrics and Gynecology

## 2014-07-13 DIAGNOSIS — R030 Elevated blood-pressure reading, without diagnosis of hypertension: Secondary | ICD-10-CM | POA: Diagnosis present

## 2014-07-13 DIAGNOSIS — B951 Streptococcus, group B, as the cause of diseases classified elsewhere: Secondary | ICD-10-CM

## 2014-07-13 DIAGNOSIS — O133 Gestational [pregnancy-induced] hypertension without significant proteinuria, third trimester: Secondary | ICD-10-CM

## 2014-07-13 DIAGNOSIS — Z3A36 36 weeks gestation of pregnancy: Secondary | ICD-10-CM | POA: Insufficient documentation

## 2014-07-13 LAB — URINALYSIS, ROUTINE W REFLEX MICROSCOPIC
Bilirubin Urine: NEGATIVE
Glucose, UA: NEGATIVE mg/dL
Hgb urine dipstick: NEGATIVE
KETONES UR: NEGATIVE mg/dL
LEUKOCYTES UA: NEGATIVE
Nitrite: NEGATIVE
Protein, ur: NEGATIVE mg/dL
Specific Gravity, Urine: 1.015 (ref 1.005–1.030)
Urobilinogen, UA: 0.2 mg/dL (ref 0.0–1.0)
pH: 7 (ref 5.0–8.0)

## 2014-07-13 LAB — CBC WITH DIFFERENTIAL/PLATELET
BASOS PCT: 0 % (ref 0–1)
Basophils Absolute: 0 10*3/uL (ref 0.0–0.1)
Eosinophils Absolute: 0.1 10*3/uL (ref 0.0–0.7)
Eosinophils Relative: 1 % (ref 0–5)
HEMATOCRIT: 33.2 % — AB (ref 36.0–46.0)
HEMOGLOBIN: 11.2 g/dL — AB (ref 12.0–15.0)
LYMPHS PCT: 12 % (ref 12–46)
Lymphs Abs: 1.8 10*3/uL (ref 0.7–4.0)
MCH: 28.9 pg (ref 26.0–34.0)
MCHC: 33.7 g/dL (ref 30.0–36.0)
MCV: 85.8 fL (ref 78.0–100.0)
Monocytes Absolute: 0.7 10*3/uL (ref 0.1–1.0)
Monocytes Relative: 5 % (ref 3–12)
NEUTROS ABS: 12.6 10*3/uL — AB (ref 1.7–7.7)
Neutrophils Relative %: 82 % — ABNORMAL HIGH (ref 43–77)
PLATELETS: 282 10*3/uL (ref 150–400)
RBC: 3.87 MIL/uL (ref 3.87–5.11)
RDW: 13 % (ref 11.5–15.5)
WBC: 15.1 10*3/uL — ABNORMAL HIGH (ref 4.0–10.5)

## 2014-07-13 LAB — COMPREHENSIVE METABOLIC PANEL
ALT: 13 U/L (ref 0–35)
ANION GAP: 7 (ref 5–15)
AST: 21 U/L (ref 0–37)
Albumin: 2.9 g/dL — ABNORMAL LOW (ref 3.5–5.2)
Alkaline Phosphatase: 109 U/L (ref 39–117)
BUN: 8 mg/dL (ref 6–23)
CO2: 25 mmol/L (ref 19–32)
Calcium: 8.9 mg/dL (ref 8.4–10.5)
Chloride: 108 mEq/L (ref 96–112)
Creatinine, Ser: 0.51 mg/dL (ref 0.50–1.10)
GFR calc Af Amer: >90 mL/min (ref >90)
GFR calc non Af Amer: >90 mL/min (ref >90)
Glucose, Bld: 93 mg/dL (ref 70–99)
POTASSIUM: 4 mmol/L (ref 3.5–5.1)
SODIUM: 140 mmol/L (ref 135–145)
Total Bilirubin: 0.3 mg/dL (ref 0.3–1.2)
Total Protein: 6.2 g/dL (ref 6.0–8.3)

## 2014-07-13 LAB — PROTEIN / CREATININE RATIO, URINE
CREATININE, URINE: 46 mg/dL
PROTEIN CREATININE RATIO: 0.15 (ref 0.00–0.15)
TOTAL PROTEIN, URINE: 7 mg/dL

## 2014-07-13 LAB — URIC ACID: URIC ACID, SERUM: 4.2 mg/dL (ref 2.4–7.0)

## 2014-07-13 LAB — LACTATE DEHYDROGENASE: LDH: 164 U/L (ref 94–250)

## 2014-07-13 MED ORDER — BUTALBITAL-APAP-CAFFEINE 50-325-40 MG PO TABS
1.0000 | ORAL_TABLET | Freq: Once | ORAL | Status: AC
  Administered 2014-07-13: 1 via ORAL
  Filled 2014-07-13: qty 1

## 2014-07-13 NOTE — MAU Note (Signed)
Urine in lab 

## 2014-07-13 NOTE — Discharge Instructions (Signed)

## 2014-07-13 NOTE — MAU Note (Signed)
[facility]

## 2014-07-13 NOTE — MAU Provider Note (Signed)
History     CSN: [id number]  Arrival date and time: 07/13/14 1251   First Provider Initiated Contact with Patient 07/13/14 1345      Chief Complaint  Patient presents with  . Hypertension   HPI  Kelly [name] is a 34 y.o. G1P0 at [redacted]w[redacted]d who presents to MAU today from the office for further evaluation of elevated BP. The patient has had 2 previous negative pre-eclampsia work-ups in MAU recently. She states headache today rated at 4/10 now. She last took Vicodin for headache at 0900 which helped. She denies blurred vision today, but has had some off and on over the last few weeks. She denies peripheral edema, abdominal pain, vaginal bleeding or LOF. She states occasional irregular contractions. She reports good fetal movement.   OB History    Gravida Para Term Preterm AB TAB SAB Ectopic Multiple Living   1               Past Medical History  Diagnosis Date  . Anxiety   . Osteonecrosis   . Asthma     exercise induced  . ADD (attention deficit disorder)     Past Surgical History  Procedure Laterality Date  . Joint replacement      bilateral knees  . Wisdom tooth extraction    . Replacement total knee bilateral      Family History  Problem Relation Age of Onset  . Hypertension Mother   . Hypertension Father   . Hypertension Maternal Grandmother   . Hypertension Maternal Grandfather   . Hypertension Paternal Grandmother   . Hypertension Paternal Grandfather     History  Substance Use Topics  . Smoking status: Never Smoker   . Smokeless tobacco: Never Used  . Alcohol Use: No    Allergies:  Allergies  Allergen Reactions  . Prednisone Other (See Comments)    Reaction:  Unknown    Prescriptions prior to admission  Medication Sig Dispense Refill Last Dose  . acetaminophen (TYLENOL) 500 MG tablet Take 1,000 mg by mouth every 6 (six) hours as needed for mild pain or headache.    07/12/2014 at Unknown time  . famotidine (PEPCID) 20 MG tablet Take 20 mg by mouth  daily.   07/13/2014 at Unknown time  . HYDROcodone-acetaminophen (NORCO/VICODIN) 5-325 MG per tablet Take 1-2 tablets by mouth every 6 (six) hours as needed for moderate pain or severe pain. 10 tablet 0 07/12/2014 at Unknown time  . Prenatal Vit-Fe Fumarate-FA (PRENATAL MULTIVITAMIN) TABS tablet Take 1 tablet by mouth daily.   07/12/2014 at Unknown time    Review of Systems  Constitutional: Negative for malaise/fatigue.  Eyes: Negative for blurred vision.  Gastrointestinal: Negative for abdominal pain.  Genitourinary:       Neg - vaginal bleeding, discharge, LOF  Neurological: Positive for headaches.   Physical Exam   Blood pressure 147/91, pulse 78, temperature 98.5 F (36.9 C), temperature source Oral, resp. rate 18, last menstrual period 09/16/2013.  Physical Exam  Constitutional: She is oriented to person, place, and time. She appears well-developed and well-nourished. No distress.  HENT:  Head: Normocephalic.  Cardiovascular: Tachycardia present.   Respiratory: Effort normal.  GI: Soft. She exhibits no distension and no mass. There is no tenderness. There is no rebound and no guarding.  Musculoskeletal: She exhibits no edema.  Neurological: She is alert and oriented to person, place, and time.  Reflex Scores:      Bicep reflexes are 3+ on the right side  and 3+ on the left side.      Brachioradialis reflexes are 2+ on the right side and 2+ on the left side.      Patellar reflexes are 3+ on the right side and 3+ on the left side. No clonus  Skin: Skin is warm and dry. No erythema.  Psychiatric: She has a normal mood and affect.   Results for orders placed or performed during the hospital encounter of 07/13/14 (from the past 24 hour(s))  Urinalysis, Routine w reflex microscopic     Status: None   Collection Time: 07/13/14  1:15 PM  Result Value Ref Range   Color, Urine YELLOW YELLOW   APPearance CLEAR CLEAR   Specific Gravity, Urine 1.015 1.005 - 1.030   pH 7.0 5.0 - 8.0    Glucose, UA NEGATIVE NEGATIVE mg/dL   Hgb urine dipstick NEGATIVE NEGATIVE   Bilirubin Urine NEGATIVE NEGATIVE   Ketones, ur NEGATIVE NEGATIVE mg/dL   Protein, ur NEGATIVE NEGATIVE mg/dL   Urobilinogen, UA 0.2 0.0 - 1.0 mg/dL   Nitrite NEGATIVE NEGATIVE   Leukocytes, UA NEGATIVE NEGATIVE  Protein / creatinine ratio, urine     Status: None   Collection Time: 07/13/14  1:15 PM  Result Value Ref Range   Creatinine, Urine 46.00 mg/dL   Total Protein, Urine 7 mg/dL   Protein Creatinine Ratio 0.15 0.00 - 0.15  CBC with Differential     Status: Abnormal   Collection Time: 07/13/14  2:40 PM  Result Value Ref Range   WBC 15.1 (H) 4.0 - 10.5 K/uL   RBC 3.87 3.87 - 5.11 MIL/uL   Hemoglobin 11.2 (L) 12.0 - 15.0 g/dL   HCT 16.1 (L) 09.6 - 04.5 %   MCV 85.8 78.0 - 100.0 fL   MCH 28.9 26.0 - 34.0 pg   MCHC 33.7 30.0 - 36.0 g/dL   RDW 40.9 81.1 - 91.4 %   Platelets 282 150 - 400 K/uL   Neutrophils Relative % 82 (H) 43 - 77 %   Neutro Abs 12.6 (H) 1.7 - 7.7 K/uL   Lymphocytes Relative 12 12 - 46 %   Lymphs Abs 1.8 0.7 - 4.0 K/uL   Monocytes Relative 5 3 - 12 %   Monocytes Absolute 0.7 0.1 - 1.0 K/uL   Eosinophils Relative 1 0 - 5 %   Eosinophils Absolute 0.1 0.0 - 0.7 K/uL   Basophils Relative 0 0 - 1 %   Basophils Absolute 0.0 0.0 - 0.1 K/uL  Comprehensive metabolic panel     Status: Abnormal   Collection Time: 07/13/14  2:40 PM  Result Value Ref Range   Sodium 140 135 - 145 mmol/L   Potassium 4.0 3.5 - 5.1 mmol/L   Chloride 108 96 - 112 mEq/L   CO2 25 19 - 32 mmol/L   Glucose, Bld 93 70 - 99 mg/dL   BUN 8 6 - 23 mg/dL   Creatinine, Ser 7.82 0.50 - 1.10 mg/dL   Calcium 8.9 8.4 - 95.6 mg/dL   Total Protein 6.2 6.0 - 8.3 g/dL   Albumin 2.9 (L) 3.5 - 5.2 g/dL   AST 21 0 - 37 U/L   ALT 13 0 - 35 U/L   Alkaline Phosphatase 109 39 - 117 U/L   Total Bilirubin 0.3 0.3 - 1.2 mg/dL   GFR calc non Af Amer >90 >90 mL/min   GFR calc Af Amer >90 >90 mL/min   Anion gap 7 5 - 15  Uric acid  Status: None   Collection Time: 07/13/14  2:40 PM  Result Value Ref Range   Uric Acid, Serum 4.2 2.4 - 7.0 mg/dL  Lactate dehydrogenase     Status: None   Collection Time: 07/13/14  2:40 PM  Result Value Ref Range   LDH 164 94 - 250 U/L   Patient Vitals for the past 24 hrs:  BP Temp Temp src Pulse Resp  07/13/14 1533 147/91 mmHg - - 78 -  07/13/14 1515 154/89 mmHg - - 85 -  07/13/14 1500 149/94 mmHg - - 81 -  07/13/14 1347 147/90 mmHg - - 93 -  07/13/14 1336 133/88 mmHg 98.5 F (36.9 C) Oral 116 18   Fetal Monitoring: Baseline: 130 bpm, moderate variability, + accelerations, no decelerations Contractions: none  MAU Course  Procedures None  MDM UA, urine protein/creatinine ratio, CBC, CMP, Uric acid and LDH today Fioricet given for headache - patient reports resolution of pain Serial BP q 15 minutes Discussed patient and results with Dr. Marcelle OverlieHolland. He has consulted with Dr. Thana AtesGrewel today as well as the patient was told in the office that induction may be scheduled as early as [facility]. Recommendation today is to discharge the patient with pre-eclampsia precautions and the office will call her with a follow-up appointment for Friday.  Assessment and Plan  A: SIUP at [id number] Gestational HTN  P: Discharge home Warning signs for pre-eclampsia discussed Patient will receive a call from the office with follow-up instructions and an appointment for Friday Patient may return to MAU as needed or if her condition were to change or worsen   Marny LowensteinJulie N Wenzel, PA-C  07/13/2014, 4:07 PM

## 2014-07-14 ENCOUNTER — Telehealth (HOSPITAL_COMMUNITY): Payer: Self-pay | Admitting: *Deleted

## 2014-07-14 NOTE — Telephone Encounter (Signed)
Preadmission screen  

## 2014-07-18 ENCOUNTER — Encounter (HOSPITAL_COMMUNITY): Payer: Self-pay | Admitting: *Deleted

## 2014-07-18 ENCOUNTER — Inpatient Hospital Stay (HOSPITAL_COMMUNITY)
Admission: AD | Admit: 2014-07-18 | Discharge: 2014-07-22 | DRG: 765 | Disposition: A | Discharge status: Home / Self Care | Payer: BLUE CROSS/BLUE SHIELD | Source: Ambulatory Visit | Attending: Obstetrics and Gynecology | Admitting: Obstetrics and Gynecology

## 2014-07-18 DIAGNOSIS — O133 Gestational [pregnancy-induced] hypertension without significant proteinuria, third trimester: Secondary | ICD-10-CM | POA: Diagnosis present

## 2014-07-18 DIAGNOSIS — Z3A37 37 weeks gestation of pregnancy: Secondary | ICD-10-CM | POA: Diagnosis present

## 2014-07-18 DIAGNOSIS — O139 Gestational [pregnancy-induced] hypertension without significant proteinuria, unspecified trimester: Secondary | ICD-10-CM | POA: Diagnosis present

## 2014-07-18 DIAGNOSIS — Z8249 Family history of ischemic heart disease and other diseases of the circulatory system: Secondary | ICD-10-CM

## 2014-07-18 DIAGNOSIS — Z96653 Presence of artificial knee joint, bilateral: Secondary | ICD-10-CM | POA: Diagnosis present

## 2014-07-18 LAB — COMPREHENSIVE METABOLIC PANEL
ALBUMIN: 2.9 g/dL — AB (ref 3.5–5.2)
ALK PHOS: 128 U/L — AB (ref 39–117)
ALT: 12 U/L (ref 0–35)
AST: 21 U/L (ref 0–37)
Anion gap: 6 (ref 5–15)
BILIRUBIN TOTAL: 0.2 mg/dL — AB (ref 0.3–1.2)
BUN: 11 mg/dL (ref 6–23)
CALCIUM: 8.9 mg/dL (ref 8.4–10.5)
CO2: 22 mmol/L (ref 19–32)
CREATININE: 0.69 mg/dL (ref 0.50–1.10)
Chloride: 108 mEq/L (ref 96–112)
GFR calc non Af Amer: >90 mL/min (ref >90)
Glucose, Bld: 72 mg/dL (ref 70–99)
Potassium: 4.1 mmol/L (ref 3.5–5.1)
Sodium: 136 mmol/L (ref 135–145)
TOTAL PROTEIN: 6 g/dL (ref 6.0–8.3)

## 2014-07-18 LAB — CBC
HCT: 33.3 % — ABNORMAL LOW (ref 36.0–46.0)
HEMOGLOBIN: 11.3 g/dL — AB (ref 12.0–15.0)
MCH: 29 pg (ref 26.0–34.0)
MCHC: 33.9 g/dL (ref 30.0–36.0)
MCV: 85.4 fL (ref 78.0–100.0)
Platelets: 297 10*3/uL (ref 150–400)
RBC: 3.9 MIL/uL (ref 3.87–5.11)
RDW: 13 % (ref 11.5–15.5)
WBC: 16.5 10*3/uL — ABNORMAL HIGH (ref 4.0–10.5)

## 2014-07-18 LAB — URIC ACID: Uric Acid, Serum: 4.9 mg/dL (ref 2.4–7.0)

## 2014-07-18 MED ORDER — LACTATED RINGERS IV SOLN
INTRAVENOUS | Status: DC
  Administered 2014-07-18 – 2014-07-20 (×4): via INTRAVENOUS

## 2014-07-18 MED ORDER — MISOPROSTOL 25 MCG QUARTER TABLET
25.0000 ug | ORAL_TABLET | ORAL | Status: DC | PRN
  Administered 2014-07-18 – 2014-07-19 (×3): 25 ug via VAGINAL
  Filled 2014-07-18 (×3): qty 0.25

## 2014-07-18 MED ORDER — LIDOCAINE HCL (PF) 1 % IJ SOLN
30.0000 mL | INTRAMUSCULAR | Status: DC | PRN
Start: 2014-07-18 — End: 2014-07-20

## 2014-07-18 MED ORDER — ZOLPIDEM TARTRATE 5 MG PO TABS
5.0000 mg | ORAL_TABLET | Freq: Every evening | ORAL | Status: DC | PRN
  Administered 2014-07-18: 5 mg via ORAL
  Filled 2014-07-18: qty 1

## 2014-07-18 MED ORDER — OXYTOCIN 40 UNITS IN LACTATED RINGERS INFUSION - SIMPLE MED: 62.5000 mL/h | INTRAVENOUS | Status: DC

## 2014-07-18 MED ORDER — OXYCODONE-ACETAMINOPHEN 5-325 MG PO TABS: 2.0000 | ORAL_TABLET | ORAL | Status: DC | PRN

## 2014-07-18 MED ORDER — CITRIC ACID-SODIUM CITRATE 334-500 MG/5ML PO SOLN
30.0000 mL | ORAL | Status: DC | PRN
  Administered 2014-07-19: 30 mL via ORAL
  Filled 2014-07-18: qty 15

## 2014-07-18 MED ORDER — PENICILLIN G POTASSIUM 5000000 UNITS IJ SOLR
5.0000 10*6.[IU] | Freq: Once | INTRAVENOUS | Status: AC
  Administered 2014-07-18: 5 10*6.[IU] via INTRAVENOUS
  Filled 2014-07-18: qty 5

## 2014-07-18 MED ORDER — TERBUTALINE SULFATE 1 MG/ML IJ SOLN: 0.2500 mg | Freq: Once | INTRAMUSCULAR | Status: AC | PRN

## 2014-07-18 MED ORDER — LACTATED RINGERS IV SOLN: 500.0000 mL | INTRAVENOUS | Status: DC | PRN

## 2014-07-18 MED ORDER — OXYCODONE-ACETAMINOPHEN 5-325 MG PO TABS: 1.0000 | ORAL_TABLET | ORAL | Status: DC | PRN

## 2014-07-18 MED ORDER — DEXTROSE 5 % IV SOLN
2.5000 10*6.[IU] | INTRAVENOUS | Status: DC
  Administered 2014-07-19 (×7): 2.5 10*6.[IU] via INTRAVENOUS
  Filled 2014-07-18 (×11): qty 2.5

## 2014-07-18 MED ORDER — FLEET ENEMA 7-19 GM/118ML RE ENEM: 1.0000 | ENEMA | RECTAL | Status: DC | PRN

## 2014-07-18 MED ORDER — ONDANSETRON HCL 4 MG/2ML IJ SOLN
4.0000 mg | Freq: Four times a day (QID) | INTRAMUSCULAR | Status: DC | PRN
  Administered 2014-07-19: 4 mg via INTRAVENOUS
  Filled 2014-07-18: qty 2

## 2014-07-18 MED ORDER — ACETAMINOPHEN 325 MG PO TABS
650.0000 mg | ORAL_TABLET | ORAL | Status: DC | PRN
  Administered 2014-07-18: 650 mg via ORAL
  Filled 2014-07-18: qty 2

## 2014-07-18 MED ORDER — OXYTOCIN BOLUS FROM INFUSION: 500.0000 mL | INTRAVENOUS | Status: DC

## 2014-07-18 MED ORDER — BUTORPHANOL TARTRATE 1 MG/ML IJ SOLN
1.0000 mg | INTRAMUSCULAR | Status: DC | PRN
  Administered 2014-07-19 (×2): 1 mg via INTRAVENOUS
  Filled 2014-07-18 (×2): qty 1

## 2014-07-18 NOTE — Progress Notes (Signed)
Bedside US: vtx 

## 2014-07-18 NOTE — H&P (Signed)
Kelly [name] is a 34 y.o. female presenting for IOL with gestational hypertension. BPs increasingly labile and increased today in office. Has HA requiring narcotics for relief and blurry vision on/off for about 1 month. Today symptoms unchanged. PIH labs have been normal on multiple checks. Maternal Medical History:  Fetal activity: Perceived fetal activity is normal.      OB History    Gravida Para Term Preterm AB TAB SAB Ectopic Multiple Living   1              Past Medical History  Diagnosis Date  . Anxiety   . Osteonecrosis   . Asthma     exercise induced  . ADD (attention deficit disorder)    Past Surgical History  Procedure Laterality Date  . Joint replacement      bilateral knees  . Wisdom tooth extraction    . Replacement total knee bilateral     Family History: family history includes Hypertension in her father, maternal grandfather, maternal grandmother, mother, paternal grandfather, and paternal grandmother. Social History:  reports that she has never smoked. She has never used smokeless tobacco. She reports that she does not drink alcohol or use illicit drugs.   Prenatal Transfer Tool  Maternal Diabetes: No Genetic Screening: Normal Maternal Ultrasounds/Referrals: Normal Fetal Ultrasounds or other Referrals:  None Maternal Substance Abuse:  No Significant Maternal Medications:  Meds include: Other: percocet for HA relief Significant Maternal Lab Results:  None Other Comments:  None  Review of Systems  HENT:       Headache for past month treated with Percocet  Eyes: Positive for blurred vision.       Blurry vision on/off for about 1 month  Gastrointestinal: Negative for abdominal pain.  Neurological: Positive for headaches.      Height 5' 4.5" (1.638 m), weight 174 lb (78.926 kg), last menstrual period 09/16/2013. Maternal Exam:  Uterine Assessment: Contraction strength is moderate.  Contraction frequency is irregular.      Fetal Exam Fetal State  Assessment: Category I - tracings are normal.     Physical Exam  Cardiovascular: Normal rate and regular rhythm.   Respiratory: Effort normal and breath sounds normal.  GI: Soft. There is no tenderness.  Neurological: She has normal reflexes.    Cervix 1/30 per Dr Lynnell DikeGrewal's check today   Prenatal labs: ABO, Rh: AB/Positive/-- (06/25 0000) Antibody: Negative (06/25 0000) Rubella: Immune (06/25 0000) RPR: Nonreactive (06/25 0000)  HBsAg: Negative (06/25 0000)  HIV: Non-reactive (06/25 0000)  GBS: Positive (11/06 0000)   Assessment/Plan: 34 yo G1P0 @ 37 3/7 wks with gestational hypertension  D/W patient and husband two stage induction and risks including fetal distress, emergent cesarean section and failed induction. Also reviewed sometimes long induction process Patient states she understands and agrees, all questions answered   Kelly [name],Kelly [name] 07/18/2014, 6:15 PM

## 2014-07-19 ENCOUNTER — Encounter (HOSPITAL_COMMUNITY): Payer: Self-pay | Admitting: Anesthesiology

## 2014-07-19 ENCOUNTER — Inpatient Hospital Stay (HOSPITAL_COMMUNITY): Payer: BLUE CROSS/BLUE SHIELD | Admitting: Anesthesiology

## 2014-07-19 ENCOUNTER — Encounter (HOSPITAL_COMMUNITY)
Admission: AD | Disposition: A | Discharge status: Home / Self Care | Payer: Self-pay | Source: Ambulatory Visit | Attending: Obstetrics and Gynecology

## 2014-07-19 LAB — CBC
HCT: 35.3 % — ABNORMAL LOW (ref 36.0–46.0)
Hemoglobin: 11.9 g/dL — ABNORMAL LOW (ref 12.0–15.0)
MCH: 28.7 pg (ref 26.0–34.0)
MCHC: 33.7 g/dL (ref 30.0–36.0)
MCV: 85.3 fL (ref 78.0–100.0)
Platelets: 313 10*3/uL (ref 150–400)
RBC: 4.14 MIL/uL (ref 3.87–5.11)
RDW: 13 % (ref 11.5–15.5)
WBC: 18.6 10*3/uL — ABNORMAL HIGH (ref 4.0–10.5)

## 2014-07-19 LAB — TYPE AND SCREEN
ABO/RH(D): AB POS
ANTIBODY SCREEN: NEGATIVE

## 2014-07-19 SURGERY — Surgical Case
Anesthesia: Epidural

## 2014-07-19 MED ORDER — KETOROLAC TROMETHAMINE 30 MG/ML IJ SOLN: 30.0000 mg | Freq: Four times a day (QID) | INTRAMUSCULAR | Status: AC | PRN

## 2014-07-19 MED ORDER — LIDOCAINE HCL (PF) 1 % IJ SOLN
INTRAMUSCULAR | Status: DC | PRN
  Administered 2014-07-19 (×2): 4 mL

## 2014-07-19 MED ORDER — PHENYLEPHRINE 40 MCG/ML (10ML) SYRINGE FOR IV PUSH (FOR BLOOD PRESSURE SUPPORT)
80.0000 ug | PREFILLED_SYRINGE | INTRAVENOUS | Status: DC | PRN
  Filled 2014-07-19: qty 20

## 2014-07-19 MED ORDER — LACTATED RINGERS IV SOLN: 500.0000 mL | Freq: Once | INTRAVENOUS | Status: DC

## 2014-07-19 MED ORDER — OXYTOCIN 40 UNITS IN LACTATED RINGERS INFUSION - SIMPLE MED
1.0000 m[IU]/min | INTRAVENOUS | Status: DC
  Administered 2014-07-19: 2 m[IU]/min via INTRAVENOUS
  Filled 2014-07-19: qty 1000

## 2014-07-19 MED ORDER — LIDOCAINE-EPINEPHRINE (PF) 2 %-1:200000 IJ SOLN
INTRAMUSCULAR | Status: AC
  Filled 2014-07-19: qty 20

## 2014-07-19 MED ORDER — FENTANYL 2.5 MCG/ML BUPIVACAINE 1/10 % EPIDURAL INFUSION (WH - ANES)
INTRAMUSCULAR | Status: DC | PRN
  Administered 2014-07-19: 14 mL/h via EPIDURAL

## 2014-07-19 MED ORDER — EPHEDRINE 5 MG/ML INJ: 10.0000 mg | INTRAVENOUS | Status: DC | PRN

## 2014-07-19 MED ORDER — KETOROLAC TROMETHAMINE 30 MG/ML IJ SOLN
30.0000 mg | Freq: Four times a day (QID) | INTRAMUSCULAR | Status: AC | PRN
  Administered 2014-07-20: 30 mg via INTRAMUSCULAR

## 2014-07-19 MED ORDER — DIPHENHYDRAMINE HCL 50 MG/ML IJ SOLN: 12.5000 mg | INTRAMUSCULAR | Status: DC | PRN

## 2014-07-19 MED ORDER — BUPIVACAINE HCL (PF) 0.25 % IJ SOLN: INTRAMUSCULAR | Status: DC | PRN

## 2014-07-19 MED ORDER — SODIUM BICARBONATE 8.4 % IV SOLN
INTRAVENOUS | Status: AC
  Filled 2014-07-19: qty 50

## 2014-07-19 MED ORDER — BUPIVACAINE HCL (PF) 0.25 % IJ SOLN
INTRAMUSCULAR | Status: AC
  Filled 2014-07-19: qty 30

## 2014-07-19 MED ORDER — MORPHINE SULFATE 0.5 MG/ML IJ SOLN
INTRAMUSCULAR | Status: AC
  Filled 2014-07-19: qty 10

## 2014-07-19 MED ORDER — SODIUM BICARBONATE 8.4 % IV SOLN
INTRAVENOUS | Status: DC | PRN
  Administered 2014-07-19: 5 mL via EPIDURAL
  Administered 2014-07-19: 10 mL via EPIDURAL

## 2014-07-19 MED ORDER — BUPIVACAINE HCL (PF) 0.25 % IJ SOLN
INTRAMUSCULAR | Status: AC
  Filled 2014-07-19: qty 10

## 2014-07-19 MED ORDER — FENTANYL 2.5 MCG/ML BUPIVACAINE 1/10 % EPIDURAL INFUSION (WH - ANES)
14.0000 mL/h | INTRAMUSCULAR | Status: DC | PRN
  Administered 2014-07-19 (×2): 14 mL/h via EPIDURAL
  Filled 2014-07-19 (×3): qty 125

## 2014-07-19 MED ORDER — OXYTOCIN 10 UNIT/ML IJ SOLN
INTRAMUSCULAR | Status: AC
  Filled 2014-07-19: qty 4

## 2014-07-19 MED ORDER — ONDANSETRON HCL 4 MG/2ML IJ SOLN
INTRAMUSCULAR | Status: AC
  Filled 2014-07-19: qty 2

## 2014-07-19 MED ORDER — PHENYLEPHRINE 40 MCG/ML (10ML) SYRINGE FOR IV PUSH (FOR BLOOD PRESSURE SUPPORT): 80.0000 ug | PREFILLED_SYRINGE | INTRAVENOUS | Status: DC | PRN

## 2014-07-19 SURGICAL SUPPLY — 30 items
BARRIER ADHS 3X4 INTERCEED (GAUZE/BANDAGES/DRESSINGS) IMPLANT
CLAMP CORD UMBIL (MISCELLANEOUS) IMPLANT
CLOTH BEACON ORANGE TIMEOUT ST (SAFETY) ×3 IMPLANT
CONTAINER PREFILL 10% NBF 15ML (MISCELLANEOUS) IMPLANT
DRAPE SHEET LG 3/4 BI-LAMINATE (DRAPES) IMPLANT
DRSG OPSITE POSTOP 4X10 (GAUZE/BANDAGES/DRESSINGS) ×3 IMPLANT
DURAPREP 26ML APPLICATOR (WOUND CARE) ×3 IMPLANT
ELECT REM PT RETURN 9FT ADLT (ELECTROSURGICAL) ×3
ELECTRODE REM PT RTRN 9FT ADLT (ELECTROSURGICAL) ×1 IMPLANT
EXTRACTOR VACUUM M CUP 4 TUBE (SUCTIONS) IMPLANT
EXTRACTOR VACUUM M CUP 4' TUBE (SUCTIONS)
GLOVE BIO SURGEON STRL SZ 6.5 (GLOVE) ×2 IMPLANT
GLOVE BIO SURGEONS STRL SZ 6.5 (GLOVE) ×1
GOWN STRL REUS W/TWL LRG LVL3 (GOWN DISPOSABLE) ×6 IMPLANT
KIT ABG SYR 3ML LUER SLIP (SYRINGE) IMPLANT
NEEDLE HYPO 22GX1.5 SAFETY (NEEDLE) IMPLANT
NEEDLE HYPO 25X5/8 SAFETYGLIDE (NEEDLE) ×3 IMPLANT
NS IRRIG 1000ML POUR BTL (IV SOLUTION) ×3 IMPLANT
PACK C SECTION WH (CUSTOM PROCEDURE TRAY) ×3 IMPLANT
PAD OB MATERNITY 4.3X12.25 (PERSONAL CARE ITEMS) ×3 IMPLANT
STAPLER VISISTAT 35W (STAPLE) IMPLANT
SUT CHROMIC 0 CTX 36 (SUTURE) ×6 IMPLANT
SUT PLAIN 0 NONE (SUTURE) IMPLANT
SUT PLAIN 2 0 XLH (SUTURE) IMPLANT
SUT VIC AB 0 CT1 27 (SUTURE) ×6
SUT VIC AB 0 CT1 27XBRD ANBCTR (SUTURE) ×3 IMPLANT
SUT VIC AB 4-0 KS 27 (SUTURE) ×3 IMPLANT
SYR CONTROL 10ML LL (SYRINGE) IMPLANT
TOWEL OR 17X24 6PK STRL BLUE (TOWEL DISPOSABLE) ×3 IMPLANT
TRAY FOLEY CATH 14FR (SET/KITS/TRAYS/PACK) ×3 IMPLANT

## 2014-07-19 NOTE — Progress Notes (Signed)
Comfortable post epidural FHR Category 1 Cervix is 90% 2.5 -2 AROM  Clear fluid  Follow labor curve Continue pitocin

## 2014-07-19 NOTE — Anesthesia Preprocedure Evaluation (Signed)
Anesthesia Evaluation  Patient identified by MRN, date of birth, ID band Patient awake    Reviewed: Allergy & Precautions, NPO status , Patient's Chart, lab work & pertinent test results  Airway Mallampati: III  TM Distance: >3 FB Neck ROM: Full    Dental no notable dental hx. 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   Abdominal   Peds  Hematology  (+) anemia ,   Anesthesia Other Findings   Reproductive/Obstetrics                             Anesthesia Physical Anesthesia Plan  ASA: III  Anesthesia Plan: Epidural   Post-op Pain Management:    Induction:   Airway Management Planned: Natural Airway  Additional Equipment:   Intra-op Plan:   Post-operative Plan:   Informed Consent: I have reviewed the patients History and Physical, chart, labs and discussed the procedure including the risks, benefits and alternatives for the proposed anesthesia with the patient or authorized representative who has indicated his/her understanding and acceptance.     Plan Discussed with: Anesthesiologist  Anesthesia Plan Comments:         Anesthesia Quick Evaluation

## 2014-07-19 NOTE — Anesthesia Procedure Notes (Signed)
Epidural Patient location during procedure: OB Start time: 07/19/2014 11:53 AM  Staffing Anesthesiologist: Majesty Stehlin A. Performed by: anesthesiologist   Preanesthetic Checklist Completed: patient identified, site marked, surgical consent, pre-op evaluation, timeout performed, IV checked, risks and benefits discussed and monitors and equipment checked  Epidural Patient position: sitting Prep: site prepped and draped and DuraPrep Patient monitoring: continuous pulse ox and blood pressure Approach: midline Location: L3-L4 Injection technique: LOR air  Needle:  Needle type: Tuohy  Needle gauge: 17 G Needle length: 9 cm and 9 Needle insertion depth: 5 cm cm Catheter type: closed end flexible Catheter size: 19 Gauge Catheter at skin depth: 10 cm Test dose: negative and Other  Assessment Events: blood not aspirated, injection not painful, no injection resistance, negative IV test and no paresthesia  Additional Notes Patient identified. Risks and benefits discussed including failed block, incomplete  Pain control, post dural puncture headache, nerve damage, paralysis, blood pressure Changes, nausea, vomiting, reactions to medications-both toxic and allergic and post Partum back pain. All questions were answered. Patient expressed understanding and wished to proceed. Sterile technique was used throughout procedure. Epidural site was Dressed with sterile barrier dressing. No paresthesias, signs of intravascular injection Or signs of intrathecal spread were encountered.  Patient was more comfortable after the epidural was dosed. Please see RN's note for documentation of vital signs and FHR which are stable.

## 2014-07-19 NOTE — Progress Notes (Signed)
Patient is status post 3 cytotec last night. Now feeling more cramping. FHR Category 1 Tocometer UC's every 2 to 3 minutes  Cervix is 50%/ 1 cm -2 Vertex is lower  IMPRESSION: IUP at 37 w 4 days Gestational Hypertension  PLAN: Start Pitocin - risks discussed with patient Patient elects for epidural in active labor

## 2014-07-20 ENCOUNTER — Encounter (HOSPITAL_COMMUNITY): Payer: Self-pay | Admitting: Obstetrics

## 2014-07-20 ENCOUNTER — Inpatient Hospital Stay (HOSPITAL_COMMUNITY): Admission: RE | Admit: 2014-07-20 | Payer: BLUE CROSS/BLUE SHIELD | Source: Ambulatory Visit

## 2014-07-20 LAB — CBC
HCT: 32.2 % — ABNORMAL LOW (ref 36.0–46.0)
HEMOGLOBIN: 10.9 g/dL — AB (ref 12.0–15.0)
MCH: 29.1 pg (ref 26.0–34.0)
MCHC: 33.9 g/dL (ref 30.0–36.0)
MCV: 85.9 fL (ref 78.0–100.0)
PLATELETS: 249 10*3/uL (ref 150–400)
RBC: 3.75 MIL/uL — AB (ref 3.87–5.11)
RDW: 13 % (ref 11.5–15.5)
WBC: 21.1 10*3/uL — ABNORMAL HIGH (ref 4.0–10.5)

## 2014-07-20 LAB — ABO/RH: ABO/RH(D): AB POS

## 2014-07-20 LAB — RPR: RPR Ser Ql: NONREACTIVE

## 2014-07-20 MED ORDER — OXYCODONE-ACETAMINOPHEN 5-325 MG PO TABS
1.0000 | ORAL_TABLET | ORAL | Status: DC | PRN
  Administered 2014-07-20: 1 via ORAL
  Filled 2014-07-20: qty 1

## 2014-07-20 MED ORDER — FENTANYL CITRATE 0.05 MG/ML IJ SOLN
INTRAMUSCULAR | Status: AC
  Administered 2014-07-20 (×2): 50 ug
  Filled 2014-07-20: qty 2

## 2014-07-20 MED ORDER — KETOROLAC TROMETHAMINE 30 MG/ML IJ SOLN
INTRAMUSCULAR | Status: AC
  Administered 2014-07-20: 30 mg via INTRAMUSCULAR
  Filled 2014-07-20: qty 1

## 2014-07-20 MED ORDER — MEPERIDINE HCL 25 MG/ML IJ SOLN: 6.2500 mg | INTRAMUSCULAR | Status: DC | PRN

## 2014-07-20 MED ORDER — PROMETHAZINE HCL 25 MG/ML IJ SOLN: 6.2500 mg | INTRAMUSCULAR | Status: DC | PRN

## 2014-07-20 MED ORDER — SODIUM CHLORIDE 0.9 % IJ SOLN: 3.0000 mL | INTRAMUSCULAR | Status: DC | PRN

## 2014-07-20 MED ORDER — ONDANSETRON HCL 4 MG/2ML IJ SOLN: 4.0000 mg | Freq: Three times a day (TID) | INTRAMUSCULAR | Status: DC | PRN

## 2014-07-20 MED ORDER — SCOPOLAMINE 1 MG/3DAYS TD PT72
1.0000 | MEDICATED_PATCH | Freq: Once | TRANSDERMAL | Status: DC
  Administered 2014-07-20: 1.5 mg via TRANSDERMAL

## 2014-07-20 MED ORDER — NALBUPHINE HCL 10 MG/ML IJ SOLN: 5.0000 mg | Freq: Once | INTRAMUSCULAR | Status: AC | PRN

## 2014-07-20 MED ORDER — ONDANSETRON HCL 4 MG/2ML IJ SOLN
INTRAMUSCULAR | Status: AC
  Filled 2014-07-20: qty 2

## 2014-07-20 MED ORDER — PHENYLEPHRINE HCL 10 MG/ML IJ SOLN
INTRAMUSCULAR | Status: DC | PRN
  Administered 2014-07-20: 80 ug via INTRAVENOUS
  Administered 2014-07-20: 40 ug via INTRAVENOUS
  Administered 2014-07-20: 80 ug via INTRAVENOUS

## 2014-07-20 MED ORDER — OXYTOCIN 10 UNIT/ML IJ SOLN
INTRAMUSCULAR | Status: AC
  Filled 2014-07-20: qty 4

## 2014-07-20 MED ORDER — NALBUPHINE HCL 10 MG/ML IJ SOLN: 5.0000 mg | INTRAMUSCULAR | Status: DC | PRN

## 2014-07-20 MED ORDER — NALOXONE HCL 1 MG/ML IJ SOLN
1.0000 ug/kg/h | INTRAVENOUS | Status: DC | PRN
  Filled 2014-07-20: qty 2

## 2014-07-20 MED ORDER — SCOPOLAMINE 1 MG/3DAYS TD PT72
MEDICATED_PATCH | TRANSDERMAL | Status: AC
  Administered 2014-07-20: 1.5 mg via TRANSDERMAL
  Filled 2014-07-20: qty 1

## 2014-07-20 MED ORDER — MORPHINE SULFATE (PF) 0.5 MG/ML IJ SOLN
INTRAMUSCULAR | Status: DC | PRN
  Administered 2014-07-20: 2000 ug via INTRAVENOUS
  Administered 2014-07-20: 3000 ug via EPIDURAL

## 2014-07-20 MED ORDER — MEPERIDINE HCL 25 MG/ML IJ SOLN
INTRAMUSCULAR | Status: AC
  Filled 2014-07-20: qty 1

## 2014-07-20 MED ORDER — FENTANYL CITRATE 0.05 MG/ML IJ SOLN: 25.0000 ug | INTRAMUSCULAR | Status: DC | PRN

## 2014-07-20 MED ORDER — ACETAMINOPHEN 160 MG/5ML PO SOLN: 325.0000 mg | ORAL | Status: DC | PRN

## 2014-07-20 MED ORDER — MIDAZOLAM HCL 2 MG/2ML IJ SOLN: 0.5000 mg | Freq: Once | INTRAMUSCULAR | Status: DC | PRN

## 2014-07-20 MED ORDER — KETOROLAC TROMETHAMINE 30 MG/ML IJ SOLN: 30.0000 mg | Freq: Once | INTRAMUSCULAR | Status: DC | PRN

## 2014-07-20 MED ORDER — DIPHENHYDRAMINE HCL 50 MG/ML IJ SOLN: 12.5000 mg | INTRAMUSCULAR | Status: DC | PRN

## 2014-07-20 MED ORDER — ACETAMINOPHEN 500 MG PO TABS
1000.0000 mg | ORAL_TABLET | Freq: Four times a day (QID) | ORAL | Status: AC
  Administered 2014-07-21 (×2): 1000 mg via ORAL
  Filled 2014-07-20 (×2): qty 2

## 2014-07-20 MED ORDER — PHENYLEPHRINE 40 MCG/ML (10ML) SYRINGE FOR IV PUSH (FOR BLOOD PRESSURE SUPPORT)
PREFILLED_SYRINGE | INTRAVENOUS | Status: AC
  Filled 2014-07-20: qty 10

## 2014-07-20 MED ORDER — LIDOCAINE-EPINEPHRINE (PF) 2 %-1:200000 IJ SOLN
INTRAMUSCULAR | Status: AC
  Filled 2014-07-20: qty 20

## 2014-07-20 MED ORDER — LACTATED RINGERS IV SOLN
INTRAVENOUS | Status: DC
  Administered 2014-07-20: 03:00:00 via INTRAVENOUS

## 2014-07-20 MED ORDER — IBUPROFEN 600 MG PO TABS
600.0000 mg | ORAL_TABLET | Freq: Four times a day (QID) | ORAL | Status: DC | PRN
  Administered 2014-07-21 – 2014-07-22 (×2): 600 mg via ORAL

## 2014-07-20 MED ORDER — ACETAMINOPHEN 325 MG PO TABS: 325.0000 mg | ORAL_TABLET | ORAL | Status: DC | PRN

## 2014-07-20 MED ORDER — OXYTOCIN 40 UNITS IN LACTATED RINGERS INFUSION - SIMPLE MED: 62.5000 mL/h | INTRAVENOUS | Status: AC

## 2014-07-20 MED ORDER — DIPHENHYDRAMINE HCL 25 MG PO CAPS: 25.0000 mg | ORAL_CAPSULE | ORAL | Status: DC | PRN

## 2014-07-20 MED ORDER — DIPHENHYDRAMINE HCL 25 MG PO CAPS: 25.0000 mg | ORAL_CAPSULE | Freq: Four times a day (QID) | ORAL | Status: DC | PRN

## 2014-07-20 MED ORDER — PRENATAL MULTIVITAMIN CH
1.0000 | ORAL_TABLET | Freq: Every day | ORAL | Status: DC
  Administered 2014-07-20 – 2014-07-22 (×3): 1 via ORAL
  Filled 2014-07-20 (×3): qty 1

## 2014-07-20 MED ORDER — 0.9 % SODIUM CHLORIDE (POUR BTL) OPTIME
TOPICAL | Status: DC | PRN
  Administered 2014-07-20: 1000 mL

## 2014-07-20 MED ORDER — CEFAZOLIN SODIUM-DEXTROSE 2-3 GM-% IV SOLR
INTRAVENOUS | Status: DC | PRN
  Administered 2014-07-20: 2 g via INTRAVENOUS

## 2014-07-20 MED ORDER — LACTATED RINGERS IV SOLN
40.0000 [IU] | INTRAVENOUS | Status: DC | PRN
  Administered 2014-07-20: 40 [IU] via INTRAVENOUS

## 2014-07-20 MED ORDER — SODIUM BICARBONATE 8.4 % IV SOLN
INTRAVENOUS | Status: AC
  Filled 2014-07-20: qty 50

## 2014-07-20 MED ORDER — MEPERIDINE HCL 25 MG/ML IJ SOLN
INTRAMUSCULAR | Status: DC | PRN
Start: 2014-07-20 — End: 2014-07-20
  Administered 2014-07-20 (×2): 12.5 mg via INTRAVENOUS

## 2014-07-20 MED ORDER — PROMETHAZINE HCL 25 MG/ML IJ SOLN
INTRAMUSCULAR | Status: AC
  Administered 2014-07-20: 12.5 mg
  Filled 2014-07-20: qty 1

## 2014-07-20 MED ORDER — ONDANSETRON HCL 4 MG/2ML IJ SOLN
INTRAMUSCULAR | Status: DC | PRN
  Administered 2014-07-20: 4 mg via INTRAVENOUS

## 2014-07-20 MED ORDER — CEFAZOLIN SODIUM-DEXTROSE 2-3 GM-% IV SOLR
INTRAVENOUS | Status: AC
  Filled 2014-07-20: qty 50

## 2014-07-20 MED ORDER — SENNOSIDES-DOCUSATE SODIUM 8.6-50 MG PO TABS
2.0000 | ORAL_TABLET | ORAL | Status: DC
  Administered 2014-07-21 (×2): 2 via ORAL
  Filled 2014-07-20 (×2): qty 2

## 2014-07-20 MED ORDER — OXYCODONE-ACETAMINOPHEN 5-325 MG PO TABS
1.0000 | ORAL_TABLET | ORAL | Status: DC | PRN
  Administered 2014-07-20 – 2014-07-22 (×5): 2 via ORAL
  Administered 2014-07-22: 1 via ORAL
  Filled 2014-07-20: qty 1
  Filled 2014-07-20 (×3): qty 2
  Filled 2014-07-20 (×2): qty 1
  Filled 2014-07-20 (×2): qty 2

## 2014-07-20 MED ORDER — MEPERIDINE HCL 25 MG/ML IJ SOLN
6.2500 mg | INTRAMUSCULAR | Status: DC | PRN
Start: 2014-07-20 — End: 2014-07-20

## 2014-07-20 MED ORDER — IBUPROFEN 600 MG PO TABS
600.0000 mg | ORAL_TABLET | Freq: Four times a day (QID) | ORAL | Status: DC
  Administered 2014-07-20 – 2014-07-22 (×7): 600 mg via ORAL
  Filled 2014-07-20 (×10): qty 1

## 2014-07-20 MED ORDER — FENTANYL CITRATE 0.05 MG/ML IJ SOLN
INTRAMUSCULAR | Status: AC
  Administered 2014-07-20: 50 ug
  Filled 2014-07-20: qty 2

## 2014-07-20 MED ORDER — NALOXONE HCL 0.4 MG/ML IJ SOLN: 0.4000 mg | INTRAMUSCULAR | Status: DC | PRN

## 2014-07-20 NOTE — Progress Notes (Signed)
Patient cared for during EPIC Downtime. Charting was completed late medications were administered as charted but not scanned. Late charting record is correct for PACU stay. Patient moved to room 143 at @;@# and report given to [facility] CareMarisella, Charity fundraiserN.

## 2014-07-20 NOTE — Progress Notes (Signed)
Subjective: Postpartum Day 1: Cesarean Delivery Patient reports tolerating PO.    Objective: Vital signs in last 24 hours: Temp:  [97.5 F (36.4 C)-99.7 F (37.6 C)] 99.1 F (37.3 C) (01/13 0404) Pulse Rate:  [53-150] 66 (01/13 0404) Resp:  [15-31] 20 (01/13 0404) BP: (115-148)/(54-106) 145/75 mmHg (01/13 0404) SpO2:  [90 %-100 %] 93 % (01/13 16100638)  Physical Exam:  General: alert and cooperative Lochia: appropriate Uterine Fundus: firm Incision: scant old drainage noted on bandage DVT Evaluation: No evidence of DVT seen on physical exam. Negative Homan's sign. No cords or calf tenderness. No significant calf/ankle edema.   Recent Labs  07/19/14 1042 07/20/14 0115  HGB 11.9* 10.9*  HCT 35.3* 32.2*    Assessment/Plan: Status post Cesarean section. Doing well postoperatively.  Continue current care.  Gleb Mcguire G 07/20/2014, 8:46 AM

## 2014-07-20 NOTE — Addendum Note (Signed)
Addendum  created 07/20/14 1356 by Graciela HusbandsWynn O Tavious Griesinger, CRNA   Modules edited: Notes Section   Notes Section:  File: [id number]

## 2014-07-20 NOTE — Op Note (Signed)
NAMTommye Standard:  Longshore, [name]                 ACCOUNT NO.:  [id number]  MEDICAL RECORD NO.:  [id number]  LOCATION:  [facility]                          FACILITY:  [facility]  PHYSICIAN:  Salah Burlison L. Earle Troiano, M.D.DATE OF BIRTH:  08-19-1980  DATE OF PROCEDURE:  07/20/2014 DATE OF DISCHARGE:                              OPERATIVE REPORT   PREOPERATIVE DIAGNOSIS:  IUP at 37 and 4 and gestational hypertension and failure to progress.  POSTOPERATIVE DIAGNOSIS:  IUP at 37 and 4 and gestational hypertension and failure to progress.  PROCEDURES:  Primary low-transverse cesarean section.  SURGEONS:  Alfred Eckley L. Vincente [name], M.D.  ANESTHESIA:  Epidural.  EBL:  Less than 500 mL.  DRAINS:  Foley.  PATHOLOGY:  None.  COMPLICATIONS:  None.  PROCEDURE IN DETAIL:  Patient was taken to the operating room.  She had been consented about the risks of the procedure.  Her epidural was dosed, she was found to be adequate.  A Foley catheter had been inserted while in Labor and Delivery.  A low transverse incision was made, carried down the fascia.  Fascia scored in the midline and extended laterally.  Rectus muscles were separated in midline.  Peritoneum was entered bluntly.  Peritoneal incision was then stretched.  The lower uterine segment was identified.  The bladder flap was created sharply and then digitally, the bladder blade was then readjusted.  A low transverse incision was made in the uterus.  Uterus was entered using a hemostat.  The baby was in transverse position and was delivered easily once I rotated the head.  The baby was a female infant.  Apgars 9 at 1 minute, 9 at 5 minutes.  The cord was clamped and cut.  The baby was handed to the awaiting neonatal team.  The cord blood was obtained in the standard fashion for cord blood collection per patient request. Uterus was exteriorized and cleared of all clots and debris after the placenta was manually removed and noted to be normal and intact.  The uterine  incision was closed in 1 layer using 0 chromic in a running, locked stitch.  The uterus was returned to the abdomen.  Irrigation performed.  Hemostasis was excellent.  The peritoneum was closed using 0 Vicryl.  The fascia was then closed using 0 Vicryl as well in a running stitch x2 starting each corner meeting in the midline.  After noting hemostasis, the skin was closed using a 4-0 Vicryl on a Keith needle in subcuticular fashion.  Steri-Strips were applied.  All sponge, lap, and instrument counts were correct x2.  Patient went to recovery room stable condition.     Kelly [name], M.D.     Florestine AversMLG/MEDQ  D:  07/20/2014  T:  07/20/2014  Job:  [id number]

## 2014-07-20 NOTE — Progress Notes (Signed)
MOB was referred for history of depression/anxiety.  Referral is screened out by Clinical Social Worker because none of the following criteria appear to apply: -History of anxiety/depression during this pregnancy, or of post-partum depression. - Diagnosis of anxiety and/or depression within last 3 years - History of depression due to pregnancy loss/loss of child or -MOB's symptoms are currently being treated with medication and/or therapy.  CSW completed chart review.  CSW noted that the MOB was diagnosed with depression and anxiety during adolescence.  CSW also noted that anxiety/depression is not identified as a problem in prenatal records.  Please contact the Clinical Social Worker if needs arise or upon MOB request.

## 2014-07-20 NOTE — Transfer of Care (Signed)
Immediate Anesthesia Transfer of Care Note  Patient: Kelly [name]  Procedure(s) Performed: Procedure(s): CESAREAN SECTION (N/A)  Patient Location: PACU  Anesthesia Type:Epidural  Level of Consciousness: awake, alert  and oriented  Airway & Oxygen Therapy: Patient Spontanous Breathing  Post-op Assessment: Report given to PACU RN and Post -op Vital signs reviewed and stable  Post vital signs: Reviewed and stable  Complications: No apparent anesthesia complications 

## 2014-07-20 NOTE — Anesthesia Postprocedure Evaluation (Signed)
Anesthesia Post Note  Patient: Tilman NeatKelly R Warnke  Procedure(s) Performed: Procedure(s) (LRB): CESAREAN SECTION (N/A)  Anesthesia type: Epidural  Patient location: Mother/Baby  Post pain: Pain level controlled  Post assessment: Post-op Vital signs reviewed  Last Vitals:  Filed Vitals:   07/20/14 1305  BP: 122/71  Pulse: 92  Temp:   Resp:     Post vital signs: Reviewed  Level of consciousness:alert  Complications: No apparent anesthesia complications

## 2014-07-20 NOTE — Transfer of Care (Signed)
Immediate Anesthesia Transfer of Care Note  Patient: Kelly [name]  Procedure(s) Performed: Procedure(s): CESAREAN SECTION (N/A)  Patient Location: PACU  Anesthesia Type:Epidural  Level of Consciousness: awake, alert  and oriented  Airway & Oxygen Therapy: Patient Spontanous Breathing  Post-op Assessment: Report given to PACU RN and Post -op Vital signs reviewed and stable  Post vital signs: Reviewed and stable  Complications: No apparent anesthesia complications

## 2014-07-20 NOTE — Lactation Note (Signed)
This note was copied from the chart of Kelly [name]. Lactation Consultation Note  37 /5 weeks, sleepy at the breast, under 6 lbs.  Mother is supplementing. Mother doing lots of STS. Set up DEBP.  Reviewed cleaning and milk storage.  Suggest mother pump at least 4-6 times a day. Give baby back volume pumped with spoon or foley cup or slow flow nipple if they choose. Reviewed hand expressing before and after pumping.  Mother has good flow of colostrum. Mother pumped 3 ml of colostrum.  Attempted latching but baby still sleepy. Finger fed with syringe approx 3 ml of colostrum.  Attempted giving formula supplement but baby would not wake to feed. Suggest parents let her sleep and feed her again within 3 hours or sooner if she cues then give her formula supplement and post pump. Encouraged mother to call if she needs further assistance.  Patient Name: Kelly [name] Today's Date: 07/20/2014     Maternal Data    Feeding Feeding Type: Breast Fed Length of feed: 25 min (intermittant)  LATCH Score/Interventions Latch: Grasps breast easily, tongue down, lips flanged, rhythmical sucking. Intervention(s): Assist with latch  Audible Swallowing: A few with stimulation  Type of Nipple: Everted at rest and after stimulation  Comfort (Breast/Nipple): Soft / non-tender     Hold (Positioning): Assistance needed to correctly position infant at breast and maintain latch.  LATCH Score: 8  Lactation Tools Discussed/Used     Consult Status      Kelly [name], Kelly [name] 07/20/2014, 12:08 PM

## 2014-07-20 NOTE — Anesthesia Postprocedure Evaluation (Signed)
  Anesthesia Post Note  Patient: Kelly [name] R Arman  Procedure(s) Performed: Procedure(s) (LRB): CESAREAN SECTION (N/A)  Anesthesia type: Epidural  Patient location: PACU  Post pain: Pain level controlled  Post assessment: Post-op Vital signs reviewed  Last Vitals:  Filed Vitals:   07/20/14 0404  BP: 145/75  Pulse: 66  Temp: 37.3 C  Resp: 20    Post vital signs: Reviewed  Level of consciousness: awake  Complications: No apparent anesthesia complications

## 2014-07-21 ENCOUNTER — Encounter (HOSPITAL_COMMUNITY): Payer: Self-pay | Admitting: Obstetrics and Gynecology

## 2014-07-21 LAB — CBC
HEMATOCRIT: 29 % — AB (ref 36.0–46.0)
Hemoglobin: 9.8 g/dL — ABNORMAL LOW (ref 12.0–15.0)
MCH: 29.3 pg (ref 26.0–34.0)
MCHC: 33.8 g/dL (ref 30.0–36.0)
MCV: 86.8 fL (ref 78.0–100.0)
PLATELETS: 238 10*3/uL (ref 150–400)
RBC: 3.34 MIL/uL — ABNORMAL LOW (ref 3.87–5.11)
RDW: 13.2 % (ref 11.5–15.5)
WBC: 16 10*3/uL — AB (ref 4.0–10.5)

## 2014-07-21 LAB — CCBB MATERNAL DONOR DRAW

## 2014-07-21 MED ORDER — OXYCODONE HCL 5 MG PO TABS
5.0000 mg | ORAL_TABLET | ORAL | Status: DC | PRN
  Administered 2014-07-21: 10 mg via ORAL
  Administered 2014-07-22: 5 mg via ORAL
  Filled 2014-07-21: qty 1
  Filled 2014-07-21: qty 2

## 2014-07-21 NOTE — Lactation Note (Signed)
This note was copied from the chart of Kelly [name]. Lactation Consultation Note     Follow up consult with this mom of a now 37 6/7 week CGA baby, SGA, last nights weight 5 pounds 2.4 oz. I reviewed the LPT policy with mom and dad, even though the baby is not truly LPI. I advised mom to breast feed every  every 3, limit time to as not to tire baby, offer supplement according to LPI plan, first EBm and then formula, and then pump . I switched baby to Neosure 22 cal, since  Fleet ContrasRachel was baby was itting up Pregestimil. Kelly [name] tolerated formula well. Mom to use premie setting for another 24 hours, and then switch to standard setting. I made an o/p appointment with lactation for mom and baby, 1/20 at 1 pm. Mom glad to hear she was doing sell, and felt comfortable with a set plan. Mom knows to call for questions/conerns.  Patient Name: Kelly Kelly [name] NWGNF'AToday's Date: 07/21/2014 Reason for consult: Follow-up assessment   Maternal Data    Feeding Feeding Type: Bottle Fed - Formula Nipple Type: Slow - flow Length of feed: 10 min  LATCH Score/Interventions Latch: Grasps breast easily, tongue down, lips flanged, rhythmical sucking. Intervention(s): Assist with latch  Audible Swallowing: None  Type of Nipple: Everted at rest and after stimulation  Comfort (Breast/Nipple): Soft / non-tender     Hold (Positioning): Assistance needed to correctly position infant at breast and maintain latch. Intervention(s): Breastfeeding basics reviewed;Support Pillows;Position options;Skin to skin  LATCH Score: 7  Lactation Tools Discussed/Used Pump Review: Setup, frequency, and cleaning;Milk Storage;Other (comment) (premie setting, limit feed to 15-20 minutes)   Consult Status Consult Status: Follow-up Date: 07/22/14 Follow-up type: In-patient    Alfred LevinsLee, Brain Honeycutt Anne 07/21/2014, 4:07 PM

## 2014-07-21 NOTE — Progress Notes (Signed)
Subjective:2 Postpartum Day 2: Cesarean Delivery Patient reports incisional pain, tolerating PO, + flatus and no problems voiding.    Objective: Vital signs in last 24 hours: Temp:  [98 F (36.7 C)-99 F (37.2 C)] 98 F (36.7 C) (01/14 0619) Pulse Rate:  [58-92] 73 (01/14 0619) Resp:  [16-18] 18 (01/14 0619) BP: (117-138)/(69-80) 117/69 mmHg (01/14 0619) SpO2:  [97 %-98 %] 98 % (01/13 2000)  Physical Exam:  General: alert and cooperative Lochia: appropriate Uterine Fundus: firm, slightly tender to palpation, has not had pain meds in over 12 hours Incision: healing well DVT Evaluation: No evidence of DVT seen on physical exam. Negative Homan's sign. No cords or calf tenderness. No significant calf/ankle edema.   Recent Labs  07/20/14 0115 07/21/14 0615  HGB 10.9* 9.8*  HCT 32.2* 29.0*    Assessment/Plan: Status post Cesarean section. Doing well postoperatively.  Encouraged pain meds and ambulation  plan to discharge in am.  CURTIS,CAROL G 07/21/2014, 8:39 AM

## 2014-07-22 MED ORDER — IBUPROFEN 600 MG PO TABS: 600.0000 mg | ORAL_TABLET | Freq: Four times a day (QID) | ORAL | Status: DC | PRN

## 2014-07-22 MED ORDER — OXYCODONE-ACETAMINOPHEN 5-325 MG PO TABS: 1.0000 | ORAL_TABLET | ORAL | Status: DC | PRN

## 2014-07-22 NOTE — Discharge Summary (Signed)
Obstetric Discharge Summary Reason for Admission: induction of labor Prenatal Procedures: ultrasound Intrapartum Procedures: cesarean: low cervical, transverse Postpartum Procedures: none Complications-Operative and Postpartum: none HEMOGLOBIN  Date Value Ref Range Status  07/21/2014 9.8* 12.0 - 15.0 g/dL Final  16/10/960405/23/2013 54.014.2 12.2 - 16.2 g/dL Final   HCT  Date Value Ref Range Status  07/21/2014 29.0* 36.0 - 46.0 % Final   HCT, POC  Date Value Ref Range Status  11/28/2011 44.3 37.7 - 47.9 % Final    Physical Exam:  General: alert and cooperative Lochia: appropriate Uterine Fundus: firm Incision: healing well DVT Evaluation: No evidence of DVT seen on physical exam. Negative Homan's sign. No cords or calf tenderness. No significant calf/ankle edema.  Discharge Diagnoses: Term Pregnancy-delivered  Discharge Information: Date: 07/22/2014 Activity: pelvic rest Diet: routine Medications: PNV, Ibuprofen and Percocet Condition: stable Instructions: refer to practice specific booklet Discharge to: home   Newborn Data: Live born female  Birth Weight: 5 lb 5.7 oz (2430 g) APGAR: 8, 9  Home with mother.  Rett Stehlik G 07/22/2014, 8:21 AM

## 2014-07-22 NOTE — Lactation Note (Signed)
This note was copied from the chart of Girl Kelly [name] Delaluz. Lactation Consultation Note  Patient Name: [name] ZOXWR'UToday's Date: 07/22/2014 Reason for consult: Follow-up assessment;Infant < 6lbs;Late preterm infant  [facility]. Mom presently pumping both breast with EBM yield. Per mom sore tender nipples, LC assessed with moms permission. Both nipples and areolas pinky red , LC feels soreness is either from the pump turned on a to high setting to start  Or potential yeast infection . Per mom denies yeast infections in pregnancy. LC had mom apply expressed milk , and the tissue appeared less red. LC instructed mom to continue comfort gels , added breast shells and showed mom how to use them. Also suggested if if the tenderness increases To give nipples a break and feed the baby EBM with a broad based nipple ( Dr. Cordelia PenBronw or Medela ) , and pump both breast for 15 -20 mins to establish and protect milk  Supply , and then at the next feeding try latching again. Use EBM liberally to breast tissue.  Discussed with mom and reviewed potential feeling behaviors with a baby less than 6 pounds and being early and the importance of feeding her at least every 3 hours  And also to pump. LC suggested feeding the baby on the 1st breast and pumping then 2nd. LC F/U apt Wednesday 1/20 . Mom and dad aware.    Maternal Data Has patient been taught Hand Expression?: Yes  Feeding Feeding Type:  (per mom baby recntly fed in the hour )  LATCH Score/Interventions Latch: Grasps breast easily, tongue down, lips flanged, rhythmical sucking. Intervention(s): Breast massage;Adjust position  Audible Swallowing: A few with stimulation Intervention(s): Hand expression;Alternate breast massage  Type of Nipple: Everted at rest and after stimulation  Comfort (Breast/Nipple): Filling, red/small blisters or bruises, mild/mod discomfort  Problem noted:  Filling Interventions (Filling): Massage;Reverse pressure  Hold (Positioning): Assistance needed to correctly position infant at breast and maintain latch. Intervention(s): Breastfeeding basics reviewed (see LC  note )  LATCH Score: 7  Lactation Tools Discussed/Used Tools: Shells;Comfort gels;Other (comment) (mom has her won DEBP at home ) Shell Type: Inverted   Consult Status Consult Status: Follow-up Date: 07/27/14 Follow-up type: Out-patient    Kathrin Greathouseorio, Leary Mcnulty Ann 07/22/2014, 11:24 AM

## 2014-07-27 ENCOUNTER — Ambulatory Visit (HOSPITAL_COMMUNITY)
Admission: RE | Admit: 2014-07-27 | Discharge: 2014-07-27 | Disposition: A | Discharge status: Home / Self Care | Payer: BLUE CROSS/BLUE SHIELD | Source: Ambulatory Visit | Attending: Obstetrics and Gynecology | Admitting: Obstetrics and Gynecology

## 2014-07-27 NOTE — Lactation Note (Signed)
Lactation Consult  Mother's reason for visit: Kelly [name]:  Feeding assessment  Appointment Notes:  Feeding assessment - confirmed  Consult:  Initial Lactation Consultant:  Kelly [name], Kelly [name]  ________________________________________________________________________  Kelly FloresBaby's Name: Kelly [name] Date of Birth: 07/20/2013 Pediatrician: DR. Earlene [name] - [facility]  Gender: female Gestational Age: 4024w5d (At Birth) Birth Weight: 5 lb 5.7 oz (2430 g) Weight at Discharge:  Weight: (!) 5 lb 0.6 oz (2285 g) Date of Discharge: 07/22/2014 Filed Weights   07/20/14 0021 07/21/14 0019 07/21/14 2352  Weight: 5 lb 5.7 oz (2430 g) 5 lb 2.4 oz (2335 g) 5 lb 0.6 oz (2285 g)   [facility]  Location:[facility] Weight today: 5-9.1 oz , 2526 g    ________________________________________________________________________  Mother's Name: Kelly [name] [name] of delivery:  C/section  Breastfeeding Experience:  Improving , but still having soreness , per mom with scale 1-2 with .latch  Maternal Medical Conditions:  Anxiety , ADD, and Asthma  Maternal Medications:  PNV, pain med  ________________________________________________________________________  Breastfeeding History (Post Discharge)  Frequency of breastfeeding:  Per mom every 3 hours  Duration of feeding: 15 mins per breast   Supplementing: occasionally with EBM with a bottle if I'M to sore to latch and she is still hungry   Pumping: DEBP - Medela , per mom post pumping R) 3 oz , L) 5 oz , up to 17 oz a day ) see plan below for bringing the post pumping down gradually   [name] Intake and Output Assessment  Voids:  8-12  in 24 hrs.  Color:  Clear yellow Stools:  2-3  in 24 hrs.  Color:  Brown and Yellow  ________________________________________________________________________  Maternal Breast  Assessment  Breast:  Full Nipple:  Erect and Flat ( semi compressible areolas ( needed to hand express prior to latch  Pain level:  2 Pain interventions:  Comfort gels and Expressed breast milk  _______________________________________________________________________ Feeding Assessment/Evaluation  Initial feeding assessment: Slight jaundice noted. Baby awake , alert and hungry.   [name]'s oral assessment:  WNL  Positioning:  Football Right breast  LATCH documentation:  Latch:  2 = Grasps breast easily, tongue down, lips flanged, rhythmical sucking.  Audible swallowing:  2 = Spontaneous and intermittent  [name] of nipple:  1 = Flat ( until enough milk was expressed off then erect   Comfort (Breast/Nipple):  1 = Filling, red/small blisters or bruises, mild/mod discomfort  Hold (Positioning):  1 = Assistance needed to correctly position [name] at breast and maintain latch  LATCH score:  7 , ( once baby latched with assist for depth and was in a consistent swallowing pattern , breast started to soften up   Attached assessment:  Shallow @ 1st   Lips flanged:  Yes.    Lips untucked:  Yes.    Suck assessment:  Nutritive  Tools:  None  Instructed on use and cleaning of tool:  None   Pre-feed weight:  2526 g , 5-9.1 oz  Post-feed weight: 2584 g , 5-11.2 oz  Amount transferred: 58 ml  Amount supplemented:  None   Wet diaper changed  Pre- weight - 2578 g , 5-11.0 oz  - ( baby didn't end up latching 2nd breast )   Total amount pumped post feed:  Hand expressed 28 ml off both breast combined prior to latch , and after for the left  side that was very full   Total amount transferred: 58 ml  Total supplement given:  None   Lactation Impression :  Baby is gaining well for a Kelly [name] , and latches well , great milk transfer of 58 ml at 7 days  Mom has plenty of milk ( questionable over supply ) - see plan for details  Mom is very motivated to make breast feeding work and  Kelly [name] ( present at consult )  Mom still experiencing some soreness with latch , but has improved compared to the hospital consult . LC feels the cause of the soreness, is the breast being to full to start so the baby slides to the nipple   Lactation Plan of Care :  Praised mom for her efforts breast feeding and grandma for her support  Per mom Kelly weight check Feb. 10 th  LC recommended [facility] weight checks at the [facility] at [facility] , until the baby is over 6 pounds and gaining well  MOM - rest , naps , plenty fluids, especially water , nutritious snacks and meals  Sore nipple prevention and tx -  Expressed milk to the nipples liberally  Cold comfort gels for the next 6 days  Prior to latching - express off fullness so the tissue behind the nipple is like a thinner sandwich prior to latch  Feedings - every 2 1/2 -3 hours and with feeding cues, Cluster feedings normal with growth spurts  Kelly [name] may feed on one breast for many feedings until she is gaining up over 6 pounds , ( always soften 1st breast well before offering the 2nd breast )  For the 2nd breast hand express, or pump down 10 -15 mins , ( try 10 mins and see if comfortable and  If not release down 15 mins )  Breast feeding goal - protect  The milk supply . If breast are feeling tight need to feel or release down with pumping.

## 2014-08-08 ENCOUNTER — Encounter (HOSPITAL_COMMUNITY): Payer: Self-pay | Admitting: Obstetrics and Gynecology

## 2014-08-08 NOTE — Addendum Note (Signed)
Addendum  created 08/08/14 0705 by Brayton CavesFreeman Sanja Elizardo, MD   Modules edited: Anesthesia Events, Narrator   Narrator:  Narrator: Event Log Edited

## 2014-08-10 ENCOUNTER — Ambulatory Visit (HOSPITAL_COMMUNITY)
Admission: RE | Admit: 2014-08-10 | Discharge: 2014-08-10 | Disposition: A | Discharge status: Home / Self Care | Payer: BLUE CROSS/BLUE SHIELD | Source: Ambulatory Visit | Attending: Obstetrics and Gynecology | Admitting: Obstetrics and Gynecology

## 2014-08-10 NOTE — Lactation Note (Signed)
Lactation Consult  Baby's Name: Kelly [name] Date of Birth: 07/20/2013 Pediatrician: Earlene Plater Gender: female Gestational Age: [redacted]w[redacted]d (At Birth) Birth Weight: 5 lb 5.7 oz (2430 g) Weight at Discharge:  Weight: (!) 5 lb 0.6 oz (2285 g) Date of Discharge: 07/22/2014 Filed Weights   07/20/14 0021 07/21/14 0019 07/21/14 2352  Weight: 5 lb 5.7 oz (2430 g) 5 lb 2.4 oz (2335 g) 5 lb 0.6 oz (2285 g)      Mother's reason for visit:  Make sure things are going well Visit Type:  Feeding assessment Appointment Notes:  Late preterm Consult:  Follow-Up Lactation Consultant:  Audry Riles D  ________________________________________________________________________    ________________________________________________________________________  Mother's Name: Kelly [name] Type of delivery:   Breastfeeding Experience:  P1  ________________________________________________________________________  Breastfeeding History (Post Discharge)  Frequency of breastfeeding:  Q 2- 1/2 hrs Duration of feeding:  20 min  Supplementation   Breastmilk:  Volume 1-2 1/2 oz Frequency:  1/day   Method:  Bottle,   Pumping  Type of pump:  Medela pump in style Frequency:  1-2 times/day Volume:  1-3 oz  Infant Intake and Output Assessment  Voids:  QS in 24 hrs.  Color:  Clear yellow Stools:  QS in 24 hrs.  Color:  Yellow  ________________________________________________________________________  Maternal Breast Assessment  Breast:  Filling Nipple:  Erect Pain level:  0 Pain interventions:  NA  _______________________________________________________________________ Feeding Assessment/Evaluation  Initial feeding assessment:  Infant's oral assessment:  WNL  Positioning:  Cradle Right breast  LATCH documentation:  Latch:  2 = Grasps breast easily, tongue down, lips flanged, rhythmical sucking.  Audible swallowing:  2 = Spontaneous and intermittent  Type of nipple:   2 = Everted at rest and after stimulation  Comfort (Breast/Nipple):  2 = Soft / non-tender  Hold (Positioning):  2 = No assistance needed to correctly position infant at breast  LATCH score:  10  Attached assessment:  Deep  Lips flanged:  Yes.    Lips untucked:  No.  Suck assessment:  Nutritive   Pre-feed weight:  2868 g  (6  lb. 5.1 oz.) Post-feed weight:  2904 g (6  lb.  6.5 oz.) Amount transferred:  36 ml Amount supplemented:  0 ml  Additional Feeding Assessment -   Infant's oral assessment:  WNL  Positioning:  Cradle Left breast  LATCH documentation:  Latch:  2 = Grasps breast easily, tongue down, lips flanged, rhythmical sucking.  Audible swallowing:  1 = A few with stimulation  Type of nipple:  2 = Everted at rest and after stimulation  Comfort (Breast/Nipple):  2 = Soft / non-tender  Hold (Positioning):  2 = No assistance needed to correctly position infant at breast  LATCH score:  9  Attached assessment:  Deep  Lips flanged:  Yes.    Lips untucked:  No.  Suck assessment:  Displays both- good deep sucks for the first 10 min, then non nutritive   Pre-feed weight:  2904 g  (6 lb. 6.5 oz.) Post-feed weight:  2922 g (6 lb. 7.1 oz.) Amount transferred:  18 ml Amount supplemented:  0 ml      Total amount transferred:  54 ml Total supplement given:  0 ml  Mom reports that baby has been feeding for shorter times- 8-10 min on each breast but then is fussy and acting hungry again in 30 min- 1 hour. Is up day and night feeding like this. Mom isn't getting enough rest. Reviewed keeping the baby awake  and watching/listening for good sucks and swallows for 15- 20 min on each breast. Encouraged breast compression and massage while Kelly [name] is nursing to get as much in her as possible so she will sleep longer stretches. Encouraged nap in the daytime whenever Kelly [name] is sleeping. No further questions at present. Suggested [facility] to weigh baby and for support.To call prn

## 2015-02-13 LAB — OB RESULTS CONSOLE ABO/RH: RH Type: POSITIVE

## 2015-02-13 LAB — OB RESULTS CONSOLE RUBELLA ANTIBODY, IGM: RUBELLA: IMMUNE

## 2015-02-13 LAB — OB RESULTS CONSOLE ANTIBODY SCREEN: Antibody Screen: NEGATIVE

## 2015-02-13 LAB — OB RESULTS CONSOLE HEPATITIS B SURFACE ANTIGEN: Hepatitis B Surface Ag: NEGATIVE

## 2015-02-13 LAB — OB RESULTS CONSOLE GC/CHLAMYDIA
Chlamydia: NEGATIVE
Gonorrhea: NEGATIVE

## 2015-02-13 LAB — OB RESULTS CONSOLE HIV ANTIBODY (ROUTINE TESTING): HIV: NONREACTIVE

## 2015-02-13 LAB — OB RESULTS CONSOLE RPR: RPR: NONREACTIVE

## 2015-08-30 NOTE — H&P (Signed)
Kelly [name] is a 35 y.o. G 2 P 1001 at  39 weeks presents for Repeat LTCS.   History OB History    Gravida Para Term Preterm AB TAB SAB Ectopic Multiple Living   0 1     Past Medical History  Diagnosis Date  . Anxiety   . Osteonecrosis   . Asthma     exercise induced  . ADD (attention deficit disorder)    Past Surgical History  Procedure Laterality Date  . Joint replacement      bilateral knees  . Wisdom tooth extraction    . Replacement total knee bilateral    . Cesarean section N/A 07/19/2014    Procedure: CESAREAN SECTION;  Surgeon: Jeani Hawking, MD;  Location: [facility];  Service: Obstetrics;  Laterality: N/A;   Family History: family history includes Hypertension in her father, maternal grandfather, maternal grandmother, mother, paternal grandfather, and paternal grandmother. Social History:  reports that she has never smoked. She has never used smokeless tobacco. She reports that she does not drink alcohol or use illicit drugs.   Prenatal Transfer Tool  Maternal Diabetes: No Genetic Screening: Normal Maternal Ultrasounds/Referrals: Normal Fetal Ultrasounds or other Referrals:  None Maternal Substance Abuse:  No Significant Maternal Medications:  None Significant Maternal Lab Results:  None Other Comments:    Review of Systems  All other systems reviewed and are negative.     unknown if currently breastfeeding. Exam Physical Exam  Nursing note and vitals reviewed. Constitutional: She appears well-developed.  HENT:  Head: Normocephalic.  Eyes: Pupils are equal, round, and reactive to light.  Neck: Normal range of motion.  Cardiovascular: Normal rate and regular rhythm.   Respiratory: Effort normal.  GI: Soft.    Prenatal labs: ABO, Rh:   Antibody:   Rubella:   RPR:    HBsAg:    HIV:    GBS:     Assessment/Plan: IUP at 39 weeks Previous C Section Repeat LTCS Consent signed   [name] 08/30/2015, 9:50 PM

## 2015-09-11 ENCOUNTER — Encounter (HOSPITAL_COMMUNITY): Payer: Self-pay

## 2015-09-11 NOTE — Patient Instructions (Signed)
Your procedure is scheduled on:  Thursday, September 14, 2015  [facility] at:  6:00 AM  Pick up the phone at the desk and dial [phone].  Call this number if you have problems the morning of surgery: [phone].  Remember:  Do NOT eat food or drink after:  Midnight Wednesday  Take these medicines the morning of surgery with a SIP OF WATER:  None  Do NOT wear jewelry (body piercing), metal hair clips/bobby pins, or nail polish. Do NOT wear lotions, powders, or perfumes.  You may wear deoderant. Do NOT shave for 48 hours prior to surgery. Do NOT bring valuables to the hospital.  Leave suitcase in car.  After surgery it may be brought to your room.  For patients admitted to the hospital, checkout time is 11:00 AM the day of discharge.

## 2015-09-12 ENCOUNTER — Encounter (HOSPITAL_COMMUNITY): Payer: Self-pay

## 2015-09-12 ENCOUNTER — Encounter (HOSPITAL_COMMUNITY)
Admission: RE | Admit: 2015-09-12 | Discharge: 2015-09-12 | Disposition: A | Discharge status: Home / Self Care | Payer: BLUE CROSS/BLUE SHIELD | Source: Ambulatory Visit | Attending: Obstetrics and Gynecology | Admitting: Obstetrics and Gynecology

## 2015-09-12 HISTORY — DX: Essential (primary) hypertension: I10

## 2015-09-12 HISTORY — DX: Irritable bowel syndrome, unspecified: K58.9

## 2015-09-12 HISTORY — DX: Raynaud's syndrome without gangrene: I73.00

## 2015-09-12 HISTORY — DX: Unspecified osteoarthritis, unspecified site: M19.90

## 2015-09-12 HISTORY — DX: Herpesviral infection, unspecified: B00.9

## 2015-09-12 HISTORY — DX: Gastro-esophageal reflux disease without esophagitis: K21.9

## 2015-09-12 HISTORY — DX: Long chain/very long chain acyl CoA dehydrogenase deficiency: E71.310

## 2015-09-12 LAB — CBC
HCT: 34.7 % — ABNORMAL LOW (ref 36.0–46.0)
HEMOGLOBIN: 11.6 g/dL — AB (ref 12.0–15.0)
MCH: 27.2 pg (ref 26.0–34.0)
MCHC: 33.4 g/dL (ref 30.0–36.0)
MCV: 81.5 fL (ref 78.0–100.0)
Platelets: 400 10*3/uL (ref 150–400)
RBC: 4.26 MIL/uL (ref 3.87–5.11)
RDW: 13.8 % (ref 11.5–15.5)
WBC: 13.3 10*3/uL — ABNORMAL HIGH (ref 4.0–10.5)

## 2015-09-12 LAB — RPR: RPR Ser Ql: NONREACTIVE

## 2015-09-12 LAB — TYPE AND SCREEN
ABO/RH(D): AB POS
ANTIBODY SCREEN: NEGATIVE

## 2015-09-12 NOTE — Anesthesia Preprocedure Evaluation (Addendum)
Anesthesia Evaluation  Patient identified by MRN, date of birth, ID band Patient awake    Reviewed: Allergy & Precautions, NPO status , Patient's Chart, lab work & pertinent test results  Airway Mallampati: II   Neck ROM: Full    Dental  (+) Teeth Intact, Dental Advisory Given   Pulmonary asthma ,    breath sounds clear to auscultation       Cardiovascular hypertension,  Rhythm:Regular     Neuro/Psych Anxiety    GI/Hepatic negative GI ROS, Neg liver ROS,   Endo/Other  negative endocrine ROS  Renal/GU negative Renal ROS     Musculoskeletal   Abdominal   Peds  Hematology   Anesthesia Other Findings   Reproductive/Obstetrics (+) Pregnancy Gest HTN                            Anesthesia Physical Anesthesia Plan  ASA: II  Anesthesia Plan: Spinal   Post-op Pain Management:    Induction:   Airway Management Planned:   Additional Equipment:   Intra-op Plan:   Post-operative Plan:   Informed Consent: I have reviewed the patients History and Physical, chart, labs and discussed the procedure including the risks, benefits and alternatives for the proposed anesthesia with the patient or authorized representative who has indicated his/her understanding and acceptance.     Plan Discussed with:   Anesthesia Plan Comments: (Check labs)        Anesthesia Quick Evaluation

## 2015-09-14 ENCOUNTER — Encounter (HOSPITAL_COMMUNITY): Payer: Self-pay | Admitting: Certified Registered Nurse Anesthetist

## 2015-09-14 ENCOUNTER — Inpatient Hospital Stay (HOSPITAL_COMMUNITY): Payer: BLUE CROSS/BLUE SHIELD | Admitting: Anesthesiology

## 2015-09-14 ENCOUNTER — Encounter (HOSPITAL_COMMUNITY)
Admission: RE | Disposition: A | Discharge status: Home / Self Care | Payer: Self-pay | Source: Ambulatory Visit | Attending: Obstetrics and Gynecology

## 2015-09-14 ENCOUNTER — Inpatient Hospital Stay (HOSPITAL_COMMUNITY)
Admission: AD | Admit: 2015-09-14 | Payer: BLUE CROSS/BLUE SHIELD | Source: Ambulatory Visit | Admitting: Obstetrics and Gynecology

## 2015-09-14 ENCOUNTER — Inpatient Hospital Stay (HOSPITAL_COMMUNITY)
Admission: RE | Admit: 2015-09-14 | Discharge: 2015-09-16 | DRG: 766 | Disposition: A | Discharge status: Home / Self Care | Payer: BLUE CROSS/BLUE SHIELD | Source: Ambulatory Visit | Attending: Obstetrics and Gynecology | Admitting: Obstetrics and Gynecology

## 2015-09-14 DIAGNOSIS — O9952 Diseases of the respiratory system complicating childbirth: Secondary | ICD-10-CM | POA: Diagnosis present

## 2015-09-14 DIAGNOSIS — Z3A39 39 weeks gestation of pregnancy: Secondary | ICD-10-CM | POA: Diagnosis not present

## 2015-09-14 DIAGNOSIS — J45909 Unspecified asthma, uncomplicated: Secondary | ICD-10-CM | POA: Diagnosis present

## 2015-09-14 DIAGNOSIS — Z98891 History of uterine scar from previous surgery: Secondary | ICD-10-CM

## 2015-09-14 DIAGNOSIS — O34211 Maternal care for low transverse scar from previous cesarean delivery: Principal | ICD-10-CM | POA: Diagnosis present

## 2015-09-14 DIAGNOSIS — Z8249 Family history of ischemic heart disease and other diseases of the circulatory system: Secondary | ICD-10-CM

## 2015-09-14 SURGERY — Surgical Case
Anesthesia: Spinal

## 2015-09-14 MED ORDER — BUPIVACAINE IN DEXTROSE 0.75-8.25 % IT SOLN
INTRATHECAL | Status: DC | PRN
Start: 2015-09-14 — End: 2015-09-14
  Administered 2015-09-14: 1.6 mL via INTRATHECAL

## 2015-09-14 MED ORDER — ACETAMINOPHEN 325 MG PO TABS: 650.0000 mg | ORAL_TABLET | ORAL | Status: DC | PRN

## 2015-09-14 MED ORDER — KETOROLAC TROMETHAMINE 30 MG/ML IJ SOLN
INTRAMUSCULAR | Status: AC
  Filled 2015-09-14: qty 1

## 2015-09-14 MED ORDER — ONDANSETRON HCL 4 MG/2ML IJ SOLN
INTRAMUSCULAR | Status: DC | PRN
  Administered 2015-09-14: 4 mg via INTRAVENOUS

## 2015-09-14 MED ORDER — DIPHENHYDRAMINE HCL 50 MG/ML IJ SOLN: 12.5000 mg | INTRAMUSCULAR | Status: DC | PRN

## 2015-09-14 MED ORDER — LACTATED RINGERS IV SOLN: 2.5000 [IU]/h | INTRAVENOUS | Status: AC

## 2015-09-14 MED ORDER — SCOPOLAMINE 1 MG/3DAYS TD PT72
MEDICATED_PATCH | TRANSDERMAL | Status: AC
  Administered 2015-09-14: 1.5 mg via TRANSDERMAL
  Filled 2015-09-14: qty 1

## 2015-09-14 MED ORDER — KETOROLAC TROMETHAMINE 30 MG/ML IJ SOLN: 30.0000 mg | Freq: Four times a day (QID) | INTRAMUSCULAR | Status: AC | PRN

## 2015-09-14 MED ORDER — LACTATED RINGERS IV SOLN: INTRAVENOUS | Status: DC

## 2015-09-14 MED ORDER — SIMETHICONE 80 MG PO CHEW: 80.0000 mg | CHEWABLE_TABLET | ORAL | Status: DC | PRN

## 2015-09-14 MED ORDER — LACTATED RINGERS IV SOLN
INTRAVENOUS | Status: DC
  Administered 2015-09-14 (×2): via INTRAVENOUS

## 2015-09-14 MED ORDER — SODIUM CHLORIDE 0.9 % IR SOLN
Status: DC | PRN
  Administered 2015-09-14: 1000 mL

## 2015-09-14 MED ORDER — LACTATED RINGERS IV SOLN
INTRAVENOUS | Status: DC
  Administered 2015-09-14: 08:00:00 via INTRAVENOUS

## 2015-09-14 MED ORDER — WITCH HAZEL-GLYCERIN EX PADS: 1.0000 "application " | MEDICATED_PAD | CUTANEOUS | Status: DC | PRN

## 2015-09-14 MED ORDER — DIBUCAINE 1 % RE OINT: 1.0000 "application " | TOPICAL_OINTMENT | RECTAL | Status: DC | PRN

## 2015-09-14 MED ORDER — TETANUS-DIPHTH-ACELL PERTUSSIS 5-2.5-18.5 LF-MCG/0.5 IM SUSP
0.5000 mL | Freq: Once | INTRAMUSCULAR | Status: DC
Start: 2015-09-15 — End: 2015-09-16

## 2015-09-14 MED ORDER — KETOROLAC TROMETHAMINE 30 MG/ML IJ SOLN
30.0000 mg | Freq: Once | INTRAMUSCULAR | Status: AC
  Administered 2015-09-14: 30 mg via INTRAMUSCULAR

## 2015-09-14 MED ORDER — PROMETHAZINE HCL 25 MG/ML IJ SOLN: 6.2500 mg | INTRAMUSCULAR | Status: DC | PRN

## 2015-09-14 MED ORDER — LANOLIN HYDROUS EX OINT: 1.0000 "application " | TOPICAL_OINTMENT | CUTANEOUS | Status: DC | PRN

## 2015-09-14 MED ORDER — MEPERIDINE HCL 25 MG/ML IJ SOLN: 6.2500 mg | INTRAMUSCULAR | Status: DC | PRN

## 2015-09-14 MED ORDER — NALOXONE HCL 0.4 MG/ML IJ SOLN: 0.4000 mg | INTRAMUSCULAR | Status: DC | PRN

## 2015-09-14 MED ORDER — NALBUPHINE HCL 10 MG/ML IJ SOLN: 5.0000 mg | INTRAMUSCULAR | Status: DC | PRN

## 2015-09-14 MED ORDER — CEFAZOLIN SODIUM-DEXTROSE 2-3 GM-% IV SOLR
INTRAVENOUS | Status: AC
  Filled 2015-09-14: qty 50

## 2015-09-14 MED ORDER — DIPHENHYDRAMINE HCL 25 MG PO CAPS: 25.0000 mg | ORAL_CAPSULE | ORAL | Status: DC | PRN

## 2015-09-14 MED ORDER — NALOXONE HCL 2 MG/2ML IJ SOSY
1.0000 ug/kg/h | PREFILLED_SYRINGE | INTRAVENOUS | Status: DC | PRN
  Filled 2015-09-14: qty 2

## 2015-09-14 MED ORDER — SENNOSIDES-DOCUSATE SODIUM 8.6-50 MG PO TABS
2.0000 | ORAL_TABLET | ORAL | Status: DC
  Administered 2015-09-14 – 2015-09-15 (×2): 2 via ORAL
  Filled 2015-09-14 (×2): qty 2

## 2015-09-14 MED ORDER — ONDANSETRON HCL 4 MG/2ML IJ SOLN
INTRAMUSCULAR | Status: AC
  Filled 2015-09-14: qty 2

## 2015-09-14 MED ORDER — FENTANYL CITRATE (PF) 100 MCG/2ML IJ SOLN: 25.0000 ug | INTRAMUSCULAR | Status: DC | PRN

## 2015-09-14 MED ORDER — MORPHINE SULFATE (PF) 0.5 MG/ML IJ SOLN
INTRAMUSCULAR | Status: DC | PRN
  Administered 2015-09-14: .2 mg via EPIDURAL

## 2015-09-14 MED ORDER — PHENYLEPHRINE 8 MG IN D5W 100 ML (0.08MG/ML) PREMIX OPTIME
INJECTION | INTRAVENOUS | Status: AC
  Filled 2015-09-14: qty 100

## 2015-09-14 MED ORDER — IBUPROFEN 600 MG PO TABS
600.0000 mg | ORAL_TABLET | Freq: Four times a day (QID) | ORAL | Status: DC
  Administered 2015-09-14 – 2015-09-16 (×7): 600 mg via ORAL
  Filled 2015-09-14 (×7): qty 1

## 2015-09-14 MED ORDER — FENTANYL CITRATE (PF) 100 MCG/2ML IJ SOLN
INTRAMUSCULAR | Status: DC | PRN
  Administered 2015-09-14: 20 ug via INTRAVENOUS

## 2015-09-14 MED ORDER — OXYTOCIN 10 UNIT/ML IJ SOLN
INTRAMUSCULAR | Status: AC
  Filled 2015-09-14: qty 4

## 2015-09-14 MED ORDER — SCOPOLAMINE 1 MG/3DAYS TD PT72
1.0000 | MEDICATED_PATCH | Freq: Once | TRANSDERMAL | Status: DC
Start: 2015-09-14 — End: 2015-09-16
  Filled 2015-09-14: qty 1

## 2015-09-14 MED ORDER — PRENATAL MULTIVITAMIN CH
1.0000 | ORAL_TABLET | Freq: Every day | ORAL | Status: DC
  Administered 2015-09-14 – 2015-09-15 (×2): 1 via ORAL
  Filled 2015-09-14 (×2): qty 1

## 2015-09-14 MED ORDER — PHENYLEPHRINE 8 MG IN D5W 100 ML (0.08MG/ML) PREMIX OPTIME
INJECTION | INTRAVENOUS | Status: DC | PRN
  Administered 2015-09-14: 60 ug/min via INTRAVENOUS

## 2015-09-14 MED ORDER — SIMETHICONE 80 MG PO CHEW
80.0000 mg | CHEWABLE_TABLET | ORAL | Status: DC
  Administered 2015-09-14 – 2015-09-15 (×2): 80 mg via ORAL
  Filled 2015-09-14 (×2): qty 1

## 2015-09-14 MED ORDER — MENTHOL 3 MG MT LOZG
1.0000 | LOZENGE | OROMUCOSAL | Status: DC | PRN
  Filled 2015-09-14: qty 9

## 2015-09-14 MED ORDER — DIPHENHYDRAMINE HCL 25 MG PO CAPS: 25.0000 mg | ORAL_CAPSULE | Freq: Four times a day (QID) | ORAL | Status: DC | PRN

## 2015-09-14 MED ORDER — SODIUM CHLORIDE 0.9% FLUSH: 3.0000 mL | INTRAVENOUS | Status: DC | PRN

## 2015-09-14 MED ORDER — OXYCODONE-ACETAMINOPHEN 5-325 MG PO TABS
1.0000 | ORAL_TABLET | ORAL | Status: DC | PRN
  Administered 2015-09-14 – 2015-09-15 (×6): 1 via ORAL
  Administered 2015-09-16 (×2): 2 via ORAL
  Filled 2015-09-14 (×3): qty 1
  Filled 2015-09-14: qty 2
  Filled 2015-09-14 (×5): qty 1

## 2015-09-14 MED ORDER — ACETAMINOPHEN 500 MG PO TABS
1000.0000 mg | ORAL_TABLET | Freq: Four times a day (QID) | ORAL | Status: AC
  Administered 2015-09-14: 1000 mg via ORAL
  Filled 2015-09-14 (×2): qty 2

## 2015-09-14 MED ORDER — MORPHINE SULFATE (PF) 0.5 MG/ML IJ SOLN
INTRAMUSCULAR | Status: AC
  Filled 2015-09-14: qty 10

## 2015-09-14 MED ORDER — NALBUPHINE HCL 10 MG/ML IJ SOLN: 5.0000 mg | Freq: Once | INTRAMUSCULAR | Status: DC | PRN

## 2015-09-14 MED ORDER — ZOLPIDEM TARTRATE 5 MG PO TABS: 5.0000 mg | ORAL_TABLET | Freq: Every evening | ORAL | Status: DC | PRN

## 2015-09-14 MED ORDER — CEFAZOLIN SODIUM-DEXTROSE 2-3 GM-% IV SOLR
2.0000 g | INTRAVENOUS | Status: AC
  Administered 2015-09-14: 2 g via INTRAVENOUS

## 2015-09-14 MED ORDER — ONDANSETRON HCL 4 MG/2ML IJ SOLN: 4.0000 mg | Freq: Three times a day (TID) | INTRAMUSCULAR | Status: DC | PRN

## 2015-09-14 MED ORDER — SCOPOLAMINE 1 MG/3DAYS TD PT72
1.0000 | MEDICATED_PATCH | Freq: Once | TRANSDERMAL | Status: DC
  Administered 2015-09-14: 1.5 mg via TRANSDERMAL

## 2015-09-14 MED ORDER — FENTANYL CITRATE (PF) 100 MCG/2ML IJ SOLN
INTRAMUSCULAR | Status: AC
  Filled 2015-09-14: qty 2

## 2015-09-14 MED ORDER — SIMETHICONE 80 MG PO CHEW
80.0000 mg | CHEWABLE_TABLET | Freq: Three times a day (TID) | ORAL | Status: DC
  Administered 2015-09-14 – 2015-09-16 (×6): 80 mg via ORAL
  Filled 2015-09-14 (×6): qty 1

## 2015-09-14 MED ORDER — OXYTOCIN 10 UNIT/ML IJ SOLN
40.0000 [IU] | INTRAVENOUS | Status: DC | PRN
  Administered 2015-09-14: 40 [IU] via INTRAVENOUS

## 2015-09-14 MED ORDER — FENTANYL CITRATE (PF) 100 MCG/2ML IJ SOLN
25.0000 ug | INTRAMUSCULAR | Status: DC | PRN
  Administered 2015-09-14: 25 ug via INTRAVENOUS
  Filled 2015-09-14: qty 2

## 2015-09-14 SURGICAL SUPPLY — 31 items
BARRIER ADHS 3X4 INTERCEED (GAUZE/BANDAGES/DRESSINGS) IMPLANT
CLAMP CORD UMBIL (MISCELLANEOUS) IMPLANT
CLOTH BEACON ORANGE TIMEOUT ST (SAFETY) ×3 IMPLANT
CONTAINER PREFILL 10% NBF 15ML (MISCELLANEOUS) IMPLANT
DRSG OPSITE POSTOP 4X10 (GAUZE/BANDAGES/DRESSINGS) ×3 IMPLANT
DURAPREP 26ML APPLICATOR (WOUND CARE) ×3 IMPLANT
ELECT REM PT RETURN 9FT ADLT (ELECTROSURGICAL) ×3
ELECTRODE REM PT RTRN 9FT ADLT (ELECTROSURGICAL) ×1 IMPLANT
EXTRACTOR VACUUM M CUP 4 TUBE (SUCTIONS) ×2 IMPLANT
EXTRACTOR VACUUM M CUP 4' TUBE (SUCTIONS) ×1
GLOVE BIO SURGEON STRL SZ 6.5 (GLOVE) ×2 IMPLANT
GLOVE BIO SURGEONS STRL SZ 6.5 (GLOVE) ×1
GLOVE BIOGEL PI IND STRL 7.0 (GLOVE) ×1 IMPLANT
GLOVE BIOGEL PI INDICATOR 7.0 (GLOVE) ×2
GOWN STRL REUS W/TWL LRG LVL3 (GOWN DISPOSABLE) ×6 IMPLANT
KIT ABG SYR 3ML LUER SLIP (SYRINGE) IMPLANT
NEEDLE HYPO 22GX1.5 SAFETY (NEEDLE) IMPLANT
NEEDLE HYPO 25X5/8 SAFETYGLIDE (NEEDLE) ×3 IMPLANT
NS IRRIG 1000ML POUR BTL (IV SOLUTION) ×3 IMPLANT
PACK C SECTION WH (CUSTOM PROCEDURE TRAY) ×3 IMPLANT
PAD OB MATERNITY 4.3X12.25 (PERSONAL CARE ITEMS) ×3 IMPLANT
PENCIL SMOKE EVAC W/HOLSTER (ELECTROSURGICAL) IMPLANT
SUT CHROMIC 0 CTX 36 (SUTURE) ×6 IMPLANT
SUT PLAIN 0 NONE (SUTURE) IMPLANT
SUT PLAIN 2 0 XLH (SUTURE) IMPLANT
SUT VIC AB 0 CT1 27 (SUTURE) ×6
SUT VIC AB 0 CT1 27XBRD ANBCTR (SUTURE) ×3 IMPLANT
SUT VIC AB 4-0 KS 27 (SUTURE) IMPLANT
SYR CONTROL 10ML LL (SYRINGE) IMPLANT
TOWEL OR 17X24 6PK STRL BLUE (TOWEL DISPOSABLE) ×3 IMPLANT
TRAY FOLEY CATH SILVER 14FR (SET/KITS/TRAYS/PACK) ×3 IMPLANT

## 2015-09-14 NOTE — Consult Note (Signed)
Neonatology Note:   Attendance at C-section:    I was asked by Dr. Grewal to attend this repeat C/S at term. The mother is a G2P1, GBS not done. No labor. ROM at delivery, fluid clear. Infant vigorous with good spontaneous cry and tone. Needed only minimal bulb suctioning. Ap 8/9. Lungs clear to ausc in DR. To CN to care of Pediatrician.  Zakary Kimura, MD 

## 2015-09-14 NOTE — Op Note (Signed)
NAMTommye Standard:  Kelly [name], Kelly [name]                 ACCOUNT NO.:  [id number]  MEDICAL RECORD NO.:  [id number]  LOCATION:  [facility]                          FACILITY:  [facility]  PHYSICIAN:  Reizel Calzada L. Lance Huaracha, M.D.DATE OF BIRTH:  09-Dec-1980  DATE OF PROCEDURE:  09/14/2015 DATE OF DISCHARGE:                              OPERATIVE REPORT   PREOPERATIVE DIAGNOSIS:  Intrauterine pregnancy at 39 weeks and 5 days and previous cesarean section.  POSTOPERATIVE DIAGNOSIS:  Intrauterine pregnancy at 39 weeks and 5 days and previous cesarean section.  PROCEDURE:  Repeat low transverse cesarean section.  SURGEON:  Huriel Matt L. Horrace Hanak, MD  ANESTHESIA:  Spinal.  EBL:  500 mL.  COMPLICATIONS:  None.  DRAINS:  Foley catheter.  PROCEDURE IN DETAIL:  The patient was taken to the operating room.  Her spinal was placed without incident.  She was prepped and draped.  A Foley catheter was inserted.  Time-out was performed.  She was tested with the Allis test and found to be adequately anesthetized.  A low transverse incision was made at the area of the previous C-section scar, carried down the fascia.  Fascia scored in the midline and extended laterally.  The rectus muscles were separated in midline.  The peritoneum was entered bluntly.  The peritoneal incision was then stretched.  The lower uterine segment was identified.  The bladder flap was created sharply and then digitally the bladder blade was then readjusted.  A low transverse incision was made in the uterus.  Uterus was entered using a hemostat.  The baby was in cephalic presentation was a female infant and delivered with 1 gentle pull of the vacuum without any pop offs.  The cord was clamped and cut.  The baby was handed to the awaiting neonatal team.  The placenta was manually removed noted to be normal intact with a three-vessel cord.  The uterus was exteriorized and clots cleared of all clots and debris.  It was closed in 1 layer using 0 chromic in a running,  locked stitch.  The uterus was returned to the abdomen.  Irrigation was performed.  Hemostasis was noted.  The peritoneum was closed using 0 Vicryl.  The fascia closed using 0 Vicryl starting each corner meeting in the midline. After irrigation of the subcutaneous layer, the skin was closed with a 4- 0 Vicryl on a Keith needle.  All sponge, lap, and instrument counts were correct x2.  The patient went to recovery room in stable condition.     Kelly [name] L. Kelly [name], M.D.     Kelly AversMLG/MEDQ  D:  09/14/2015  T:  09/14/2015  Job:  [id number]

## 2015-09-14 NOTE — Progress Notes (Signed)
H and P on the chart No significant changes Consent signed 

## 2015-09-14 NOTE — Progress Notes (Signed)
MOB was referred for history of depression/anxiety.  Referral is screened out by Clinical Social Worker because none of the following criteria appear to apply: -History of anxiety/depression during this pregnancy, or of post-partum depression. - Diagnosis of anxiety and/or depression within last 3 years or -MOB's symptoms are currently being treated with medication and/or therapy.  Per chart review, CSW screened referral out in January 2016 for history of anxiety. MOB was diagnosed with anxiety as an adolescent.  During previous pregnancy and during current pregnancy, anxiety/depression are not identified as a current/presenting problem.   Please contact the Clinical Social Worker if needs arise or upon MOB request.   Loleta BooksSarah Ela Moffat MSW, LCSW [phone]

## 2015-09-14 NOTE — Transfer of Care (Signed)
Immediate Anesthesia Transfer of Care Note  Patient: Kelly [name]  Procedure(s) Performed: Procedure(s): REPEAT CESAREAN SECTION (N/A)  Patient Location: PACU  Anesthesia Type:Spinal  Level of Consciousness: awake, alert , oriented and patient cooperative  Airway & Oxygen Therapy: Patient Spontanous Breathing  Post-op Assessment: Report given to RN and Post -op Vital signs reviewed and stable  Post vital signs: Reviewed and stable  Last Vitals:  Filed Vitals:   09/14/15 0611  BP: 129/89  Pulse: 105  Temp: 36.6 C  Resp: 20    Complications: No apparent anesthesia complications

## 2015-09-14 NOTE — Lactation Note (Addendum)
This note was copied from a baby's chart. Lactation Consultation Note  P2. BF other child for 4 months. States this baby is latching easily and has bf x2 since birth. Reviewed pp 24-25 in Baby & Me booklet. Discussed making sure baby is deep on breast and cluster feeding. Mom made aware of O/P services, breastfeeding support groups, community resources, and our phone # for post-discharge questions.    Patient Name: Boy [name] StandardKelly Morrissey Today's Date: 09/14/2015 Reason for consult: Initial assessment   Maternal Data Has patient been taught Hand Expression?: Yes Does the patient have breastfeeding experience prior to this delivery?: Yes  Feeding    LATCH Score/Interventions                      Lactation Tools Discussed/Used     Consult Status Consult Status: Follow-up Date: 09/15/15 Follow-up type: In-patient    Dahlia ByesBerkelhammer, Nuri Branca [facility] 09/14/2015, 12:57 PM

## 2015-09-14 NOTE — Progress Notes (Addendum)
Patient called bc IV beeping; assessed IV site and IV was out.  Dressing and tape not sticking to skin due to sweat.  Patient's temp 97.3 but states she feels warm.  Spoke with Dr. Rana SnareLowe to inform him that all of the bag of IV Pitocin did not get in before second IV came out; MD OK with that.  Will continue to monitor.  Bleeding small and fundus has been 2 below.   Vivi MartensAshley Abbigal Radich RN

## 2015-09-14 NOTE — Anesthesia Postprocedure Evaluation (Signed)
Anesthesia Post Note  Patient: Tilman NeatKelly R Crespo  Procedure(s) Performed: Procedure(s) (LRB): REPEAT CESAREAN SECTION (N/A)  Patient location during evaluation: PACU Anesthesia Type: Spinal Level of consciousness: oriented and awake and alert Pain management: pain level controlled Vital Signs Assessment: post-procedure vital signs reviewed and stable Respiratory status: spontaneous breathing, respiratory function stable and patient connected to nasal cannula oxygen Cardiovascular status: blood pressure returned to baseline and stable Postop Assessment: no headache and no backache Anesthetic complications: no Comments: SBegining to move toes.  Ambulation precautions given    Last Vitals:  Filed Vitals:   09/14/15 0611 09/14/15 0818  BP: 129/89 119/72  Pulse: 105 81  Temp: 36.6 C 36.4 C  Resp: 20     Last Pain: There were no vitals filed for this visit.               Jocelyn Lowery

## 2015-09-14 NOTE — Progress Notes (Addendum)
Called to Patient's room because her IV came out.  Patient's skin feels cool and clammy to the touch, temp was as low as 96.0 axillary- would not register orally.  CBG obtained, was 75.  Called Dr. Berneice HeinrichManny and got the OK to start bear hugger if warm blankets and turning the heat up in the room did not bring her temperature up.  Patient states that she is having what feels like "hot flashes."  Did not see a good vein to stick and patient stated they blew one earlier this am in Pre Op.  Short stay RN called to help with IV start.  New IV started in left wrist.  Temperature brought up to 97.8 with warm blankets and warming the room.  Will continue to monitor.  Vivi MartensAshley Gregery Walberg RN

## 2015-09-14 NOTE — Anesthesia Procedure Notes (Signed)
Spinal Patient location during procedure: OR Start time: 09/14/2015 7:31 AM End time: 09/14/2015 7:42 AM Staffing Anesthesiologist: Alexis Frock Preanesthetic Checklist Completed: patient identified, site marked, surgical consent, pre-op evaluation, timeout performed, IV checked, risks and benefits discussed and monitors and equipment checked Spinal Block Patient position: sitting Prep: site prepped and draped and DuraPrep Patient monitoring: heart rate, continuous pulse ox, blood pressure and cardiac monitor Approach: midline Location: L4-5 Injection technique: single-shot Needle Needle type: Introducer and Pencil-Tip  Needle gauge: 24 G Needle length: 9 cm Needle insertion depth: 5 cm Assessment Sensory level: T4 Additional Notes Negative paresthesia. Negative blood return. Positive free-flowing CSF. Expiration date of kit checked and confirmed. Patient tolerated procedure well, without complications.

## 2015-09-14 NOTE — Brief Op Note (Signed)
09/14/2015  8:16 AM  PATIENT:  Tilman NeatKelly R Sigl  35 y.o. female  PRE-OPERATIVE DIAGNOSIS:   IUP at 8539 w 5 days Previous C Section  POST-OPERATIVE DIAGNOSIS:   Same  PROCEDURE:  Procedure(s): REPEAT CESAREAN SECTION (N/A)  SURGEON:  Surgeon(s) and Role:    * Marcelle OverlieMichelle Keelie Zemanek, MD - Primary  PHYSICIAN ASSISTANT:   ASSISTANTS: none   ANESTHESIA:   spinal  EBL:  Total I/O In: 1000 [I.V.:1000] Out: 475 [Urine:225; Blood:250]  BLOOD ADMINISTERED:none  DRAINS: Urinary Catheter (Foley)   LOCAL MEDICATIONS USED:  NONE  SPECIMEN:  No Specimen  DISPOSITION OF SPECIMEN:  N/A  COUNTS:  YES  TOURNIQUET:  * No tourniquets in log *  DICTATION: .Other Dictation: Dictation Number [id number]  PLAN OF CARE: Admit to inpatient   PATIENT DISPOSITION:  PACU - hemodynamically stable.   Delay start of Pharmacological VTE agent (>24hrs) due to surgical blood loss or risk of bleeding: not applicable

## 2015-09-14 NOTE — Progress Notes (Signed)
Patient complaining of worse pain after PO Tylenol.  Called Dr. Berneice HeinrichManny and received telephone order for Fentanyl; Medication given.

## 2015-09-15 LAB — CBC
HCT: 29 % — ABNORMAL LOW (ref 36.0–46.0)
Hemoglobin: 9.4 g/dL — ABNORMAL LOW (ref 12.0–15.0)
MCH: 26.9 pg (ref 26.0–34.0)
MCHC: 32.4 g/dL (ref 30.0–36.0)
MCV: 82.9 fL (ref 78.0–100.0)
Platelets: 331 10*3/uL (ref 150–400)
RBC: 3.5 MIL/uL — ABNORMAL LOW (ref 3.87–5.11)
RDW: 13.8 % (ref 11.5–15.5)
WBC: 15.3 10*3/uL — ABNORMAL HIGH (ref 4.0–10.5)

## 2015-09-15 LAB — BIRTH TISSUE RECOVERY COLLECTION (PLACENTA DONATION)

## 2015-09-15 MED ORDER — MENTHOL 3 MG MT LOZG: 1.0000 | LOZENGE | OROMUCOSAL | Status: DC | PRN

## 2015-09-15 NOTE — Lactation Note (Signed)
This note was copied from a baby's chart. Lactation Consultation Note  Patient Name: Kelly [name] ZOXWR'UToday's Date: 09/15/2015 Reason for consult: Follow-up assessment   Follow up with mom of 30 hour old infant. Infant with 4 BF for 10-50 minutes, 3 formula feeds via syringe/bottle of 3-15 cc, 3 voids and 5 stools in last 24 hours. Mom reports that she has put infant to breast >4 feeds in last 24 hours. Mom's plan is to BF but feels infant still hungry post BF so she has given bottles of formuls. She has a Personal Medela PIS DEBP at bedside and has been pumping, she is not receiving much colostrum at this point. Discussed milk coming to volume.  Mom denies nipple pain, she reports increasing breast fullness today. Infant currently asleep in crib. He was circumcised today. Mom denied needing assistance at this time. Enc her to call with questions/concerns/assistance as needed.    Maternal Data Formula Feeding for Exclusion: No Does the patient have breastfeeding experience prior to this delivery?: Yes  Feeding Feeding Type: Breast Fed Length of feed: 45 min  LATCH Score/Interventions                      Lactation Tools Discussed/Used     Consult Status Consult Status: Follow-up Date: 09/16/15 Follow-up type: In-patient    Kelly FloodSharon S Zayda [name] 09/15/2015, 2:06 PM

## 2015-09-15 NOTE — Progress Notes (Signed)
Subjective: Postpartum Day 1: Cesarean Delivery Patient reports tolerating PO and no problems voiding.  Complains of numbness in R forearm. No difficulty with movement. Did have carpal tunnel during pregnancy  Objective: Vital signs in last 24 hours: Temp:  [96.5 F (35.8 C)-98.1 F (36.7 C)] 98.1 F (36.7 C) (03/10 0509) Pulse Rate:  [57-87] 70 (03/10 0509) Resp:  [10-24] 16 (03/10 0509) BP: (100-133)/(54-85) 115/54 mmHg (03/10 0509) SpO2:  [96 %-100 %] 98 % (03/10 0230)  Physical Exam:  General: alert and cooperative Lochia: appropriate Uterine Fundus: firm Incision: healing well DVT Evaluation: No evidence of DVT seen on physical exam. Negative Homan's sign. No cords or calf tenderness. No significant calf/ankle edema. R forearm without discoloration or erythema  Recent Labs  09/12/15 0850 09/15/15 0543  HGB 11.6* 9.4*  HCT 34.7* 29.0*    Assessment/Plan: Status post Cesarean section. Doing well postoperatively.  Continue current care.  Belky Mundo G 09/15/2015, 8:20 AM

## 2015-09-16 ENCOUNTER — Encounter (HOSPITAL_COMMUNITY): Payer: Self-pay | Admitting: Obstetrics and Gynecology

## 2015-09-16 MED ORDER — OXYCODONE-ACETAMINOPHEN 5-325 MG PO TABS: 1.0000 | ORAL_TABLET | ORAL | Status: DC | PRN

## 2015-09-16 MED ORDER — IBUPROFEN 600 MG PO TABS: 600.0000 mg | ORAL_TABLET | Freq: Four times a day (QID) | ORAL | Status: DC

## 2015-09-16 NOTE — Discharge Summary (Signed)
Obstetric Discharge Summary Reason for Admission: cesarean section Prenatal Procedures: none Intrapartum Procedures: cesarean: low cervical, transverse Postpartum Procedures: none Complications-Operative and Postpartum: none HEMOGLOBIN  Date Value Ref Range Status  09/15/2015 9.4* 12.0 - 15.0 g/dL Final  40/98/119105/23/2013 47.814.2 12.2 - 16.2 g/dL Final   HCT  Date Value Ref Range Status  09/15/2015 29.0* 36.0 - 46.0 % Final   HCT, POC  Date Value Ref Range Status  11/28/2011 44.3 37.7 - 47.9 % Final    Physical Exam:  General: alert, cooperative and appears stated age 64Lochia: appropriate Uterine Fundus: firm Incision: healing well, no significant drainage, no dehiscence, no significant erythema DVT Evaluation: No evidence of DVT seen on physical exam.  Discharge Diagnoses: Term Pregnancy-delivered  Discharge Information: Date: 09/16/2015 Activity: pelvic rest Diet: routine Medications: Ibuprofen and Percocet Condition: improved Instructions: refer to practice specific booklet Discharge to: home   Newborn Data: Live born female  Birth Weight: 6 lb 15.5 oz (3160 g) APGAR: 8, 9  Home with mother.  Arkin Imran L 09/16/2015, 7:37 AM

## 2015-09-16 NOTE — Lactation Note (Signed)
This note was copied from a baby's chart. Lactation Consultation Note  Patient Name: Boy [name] StandardKelly Oelkers WUJWJ'XToday's Date: 09/16/2015 Reason for consult: Follow-up assessment  Baby 48 hours old. Baby nursing well in cross-cradle position. Mom reports that she had supplemented earlier, but her milk started coming to volume last night and so she does not plan to supplement anymore. Mom reports that she enjoyed nursing first child and she is happy to be able to nurse this baby. Referred mom to Baby and Me booklet for number of diapers to expect by day of life and EBM storage guidelines.  Maternal Data    Feeding Feeding Type: Breast Fed Length of feed:  (LC assessed 10 minutes of BF.)  LATCH Score/Interventions Latch: Grasps breast easily, tongue down, lips flanged, rhythmical sucking.  Audible Swallowing: Spontaneous and intermittent Intervention(s): Skin to skin  Type of Nipple: Everted at rest and after stimulation  Comfort (Breast/Nipple): Soft / non-tender     Hold (Positioning): No assistance needed to correctly position infant at breast. Intervention(s): Skin to skin  LATCH Score: 10  Lactation Tools Discussed/Used     Consult Status Consult Status: PRN    Geralynn OchsWILLIARD, Zarin Hagmann 09/16/2015, 8:51 AM

## 2015-09-17 LAB — GLUCOSE, CAPILLARY: GLUCOSE-CAPILLARY: 75 mg/dL (ref 65–99)

## 2016-03-20 ENCOUNTER — Other Ambulatory Visit: Payer: Self-pay | Admitting: Family Medicine

## 2016-03-20 DIAGNOSIS — R51 Headache: Principal | ICD-10-CM

## 2016-03-20 DIAGNOSIS — R519 Headache, unspecified: Secondary | ICD-10-CM

## 2016-03-27 ENCOUNTER — Encounter: Payer: Self-pay | Admitting: Neurology

## 2016-03-27 ENCOUNTER — Telehealth: Payer: Self-pay | Admitting: Neurology

## 2016-03-27 ENCOUNTER — Ambulatory Visit (INDEPENDENT_AMBULATORY_CARE_PROVIDER_SITE_OTHER): Payer: BLUE CROSS/BLUE SHIELD | Admitting: Neurology

## 2016-03-27 ENCOUNTER — Other Ambulatory Visit: Payer: BLUE CROSS/BLUE SHIELD

## 2016-03-27 VITALS — BP 124/85 | HR 73 | Ht 64.0 in | Wt 170.0 lb

## 2016-03-27 DIAGNOSIS — F05 Delirium due to known physiological condition: Secondary | ICD-10-CM

## 2016-03-27 DIAGNOSIS — R51 Headache with orthostatic component, not elsewhere classified: Secondary | ICD-10-CM

## 2016-03-27 DIAGNOSIS — R410 Disorientation, unspecified: Secondary | ICD-10-CM

## 2016-03-27 DIAGNOSIS — G8929 Other chronic pain: Secondary | ICD-10-CM

## 2016-03-27 DIAGNOSIS — H532 Diplopia: Secondary | ICD-10-CM | POA: Diagnosis not present

## 2016-03-27 DIAGNOSIS — R519 Headache, unspecified: Secondary | ICD-10-CM

## 2016-03-27 NOTE — Telephone Encounter (Signed)
Patient called regarding scheduling MRI for this afternoon, please call [phone]. States she spoke w/someone earlier that advised if she could be here at 5:30 today for MRI at 6:00, patient states she can be here at 5:30pm.

## 2016-03-27 NOTE — Telephone Encounter (Signed)
I believe this patient could have [facility]? We do not have any apts this late. I spoke with patient earlier and canceled her 3:30 apt and told [facility] to [facility]. She stated that she already had their number.  Kelly [name];  would you mind calling to let her know she needs to call them?

## 2016-03-27 NOTE — Patient Instructions (Addendum)
Remember to drink plenty of fluid, eat healthy meals and do not skip any meals. Try to eat protein with a every meal and eat a healthy snack such as fruit or nuts in between meals. Try to keep a regular sleep-wake schedule and try to exercise daily, particularly in the form of walking, 20-30 minutes a day, if you can.   As far as diagnostic testing: MRI of the brain w/wo contrast   Our phone number is [phone]. We also have an after hours call service for urgent matters and there is a physician on-call for urgent questions. For any emergencies you know to call 911 or go to the nearest emergency room

## 2016-03-27 NOTE — Progress Notes (Signed)
[facility]    Provider:  Dr Lucia Gaskins Referring Provider: Marcelle Overlie, MD Primary Care Physician:  Mickie Hillier, MD  CC:  headaches  HPI:  Kelly [name] is a 35 y.o. female here as a referral from Dr. Vincente Poli for headaches. Past medical history attention deficit disorder, other chronic pain, rheumatoid arthritis. Had her baby in March. Since May she has had daily headaches. Some days it gets so back that she can;t think and is confused. The headaches are across the top of the head. She has pain does the front of her head on the top of her head. It is pressure. It can feel sharp. It can be 8/10. No significant light or sound sensitivity. She has nause when it is severe. She has vision changes especially when it is hurting badly very blurry almost double vision. Continuous all day long. Waxes and wanes but she wake sup with it and she goes to bed with it. No inciting events. Tylenol, ibuprofen and alleve don't help she does not take these anymore. Nausea. Not vomiting. She has decreased concentration. Worse in the morning and at the end of the day positional. She was treated for a month of doxycycline for lyme in April. She is not nursing. She feels confused and decreased concentration when headache is severe. No other associated symptoms of modifying factors. There is increased stress with 2 kids. No FHx of migraines.   Reviewed notes, labs and imaging from outside physicians, which showed:  BUN 14, creatinine 0.8 to 01/05/2016   Review of Systems: Patient complains of symptoms per HPI as well as the following symptoms: Blurred vision, headache, numbness. Pertinent negatives per HPI. All others negative.   Social History   Social History  . Marital status: Married    Spouse name: Joey  . Number of children: 2  . Years of education: Bachelor's   Occupational History  . Not on file.   Social History Main Topics  . Smoking status: Never Smoker  . Smokeless  tobacco: Never Used  . Alcohol use No  . Drug use: No  . Sexual activity: Yes   Other Topics Concern  . Not on file   Social History Narrative   Lives with husband, Joey   Caffeine use: Drinks 1 cup coffee per day   No tea or soda    Family History  Problem Relation Age of Onset  . Hypertension Mother   . Hypertension Father   . Hypertension Maternal Grandmother   . Hypertension Maternal Grandfather   . Hypertension Paternal Grandmother   . Hypertension Paternal Grandfather     Past Medical History:  Diagnosis Date  . ADD (attention deficit disorder)   . Anxiety   . Arthritis   . Asthma    exercise induced  . GERD (gastroesophageal reflux disease)    with pregnancy  . HSV-1 (herpes simplex virus 1) infection   . Hypertension    with 1st pregnancy  . IBS (irritable bowel syndrome)    Constipation  . 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 Osteonecrosis (HCC)   . Raynaud's disease     Past Surgical History:  Procedure Laterality Date  . CESAREAN SECTION N/A 07/19/2014   Procedure: CESAREAN SECTION;  Surgeon: Jeani Hawking, MD;  Location: [facility];  Service: Obstetrics;  Laterality: N/A;  . CESAREAN SECTION N/A 09/14/2015   Procedure: REPEAT CESAREAN SECTION;  Surgeon: Marcelle Overlie, MD;  Location: [facility];  Service: Obstetrics;  Laterality: N/A;  . JOINT REPLACEMENT     bilateral knees  . KNEE ARTHROSCOPY     Multiple  . REPLACEMENT TOTAL KNEE BILATERAL    . WISDOM TOOTH EXTRACTION      Current Outpatient Prescriptions  Medication Sig Dispense Refill  . acetaminophen (TYLENOL) 325 MG tablet Take 650 mg by mouth every 6 (six) hours as needed for mild pain.    Marland Kitchen. ibuprofen (ADVIL,MOTRIN) 600 MG tablet Take 1 tablet (600 mg total) by mouth every 6 (six) hours. 60 tablet 0  . ketoprofen (ORUDIS) 75 MG capsule Take 75 mg by mouth 2 (two) times daily.  0  . Prenatal Vit-Fe Fumarate-FA (PRENATAL MULTIVITAMIN) TABS tablet Take 1 tablet by  mouth daily.    Marland Kitchen. topiramate (TOPAMAX) 25 MG tablet Take 25 mg by mouth daily.  1   No current facility-administered medications for this visit.     Allergies as of 03/27/2016 - Review Complete 03/27/2016  Allergen Reaction Noted  . Latex Itching 07/18/2014  . Prednisone Other (See Comments) 05/13/2014    Vitals: BP 124/85 (BP Location: Right Arm, Patient Position: Sitting, Cuff Size: Normal)   Pulse 73   Ht 5\' 4"  (1.626 m)   Wt 170 lb (77.1 kg)   BMI 29.18 kg/m  Last Weight:  Wt Readings from Last 1 Encounters:  03/27/16 170 lb (77.1 kg)   Last Height:   Ht Readings from Last 1 Encounters:  03/27/16 5\' 4"  (1.626 m)    Physical exam: Exam: Gen: NAD, conversant, well nourised, well groomed                     CV: RRR, no MRG. No Carotid Bruits. No peripheral edema, warm, nontender Eyes: Conjunctivae clear without exudates or hemorrhage  Neuro: Detailed Neurologic Exam  Speech:    Speech is normal; fluent and spontaneous with normal comprehension.  Cognition:    The patient is oriented to person, place, and time;     recent and remote memory intact;     language fluent;     normal attention, concentration,     fund of knowledge Cranial Nerves:    The pupils are equal, round, and reactive to light. The fundi are normal and spontaneous venous pulsations are present. Visual fields are full to finger confrontation. Extraocular movements are intact. Trigeminal sensation is intact and the muscles of mastication are normal. The face is symmetric. The palate elevates in the midline. Hearing intact. Voice is normal. Shoulder shrug is normal. The tongue has normal motion without fasciculations.   Coordination:    Normal finger to nose and heel to shin. Normal rapid alternating movements.   Gait:    Heel-toe and tandem gait are normal.   Motor Observation:    No asymmetry, no atrophy, and no involuntary movements noted. Tone:    Normal muscle tone.    Posture:     Posture is normal. normal erect    Strength:    Strength is V/V in the upper and lower limbs.      Sensation: intact to LT     Reflex Exam:  DTR's:    Deep tendon reflexes in the upper and lower extremities are normal bilaterally.   Toes:    The toes are downgoing bilaterally.   Clonus:    Clonus is absent.      Assessment/Plan:  35 year old with chronic intractable daily headaches refractory with vision changes, nausea, positional developed after having her second child.  MRI of the brain w/wo contrast we can schedule her today at 3pm if she can get back here to the office. Pending results can make recommendations for treatment.   Discussed: To prevent or relieve headaches, try the following: Cool Compress. Lie down and place a cool compress on your head.  Avoid headache triggers. If certain foods or odors seem to have triggered your migraines in the past, avoid them. A headache diary might help you identify triggers.  Include physical activity in your daily routine. Try a daily walk or other moderate aerobic exercise.  Manage stress. Find healthy ways to cope with the stressors, such as delegating tasks on your to-do list.  Practice relaxation techniques. Try deep breathing, yoga, massage and visualization.  Eat regularly. Eating regularly scheduled meals and maintaining a healthy diet might help prevent headaches. Also, drink plenty of fluids.  Follow a regular sleep schedule. Sleep deprivation might contribute to headaches Consider biofeedback. With this mind-body technique, you learn to control certain bodily functions - such as muscle tension, heart rate and blood pressure - to prevent headaches or reduce headache pain.    Proceed to emergency room if you experience new or worsening symptoms or symptoms do not resolve, if you have new neurologic symptoms or if headache is severe, or for any concerning symptom.   Cc: Dr. Vincente Poli, Dr. Royston Bake, MD  [facility] [address] [place], [place] [address]  Phone [phone] Fax [phone]  A total of 45 minutes was spent in with this patient face to face. Over half this time was spent on counseling patient on the headache diagnosis and different therapeutic options available.

## 2016-03-28 ENCOUNTER — Other Ambulatory Visit: Payer: Self-pay | Admitting: Family Medicine

## 2016-03-28 DIAGNOSIS — R519 Headache, unspecified: Secondary | ICD-10-CM

## 2016-03-28 DIAGNOSIS — R51 Headache: Principal | ICD-10-CM

## 2016-03-29 ENCOUNTER — Ambulatory Visit
Admission: RE | Admit: 2016-03-29 | Discharge: 2016-03-29 | Disposition: A | Discharge status: Home / Self Care | Payer: BLUE CROSS/BLUE SHIELD | Source: Ambulatory Visit | Attending: Family Medicine | Admitting: Family Medicine

## 2016-03-29 ENCOUNTER — Other Ambulatory Visit: Payer: BLUE CROSS/BLUE SHIELD

## 2016-03-29 DIAGNOSIS — R51 Headache: Principal | ICD-10-CM

## 2016-03-29 DIAGNOSIS — R519 Headache, unspecified: Secondary | ICD-10-CM

## 2016-03-29 MED ORDER — GADOBENATE DIMEGLUMINE 529 MG/ML IV SOLN
15.0000 mL | Freq: Once | INTRAVENOUS | Status: AC | PRN
  Administered 2016-03-29: 15 mL via INTRAVENOUS

## 2016-04-02 ENCOUNTER — Telehealth: Payer: Self-pay | Admitting: Neurology

## 2016-04-02 NOTE — Telephone Encounter (Signed)
CALLED PT TO R/S 10/18 APPT PT STATES HA WORSE & WANTS SOONER APPT PT DOES NOT WANT TO BE R/S TO A LATER DATE. PT GIVEN NEW APPT 10/11 AT 0730  CB

## 2016-04-09 ENCOUNTER — Telehealth: Payer: Self-pay | Admitting: *Deleted

## 2016-04-09 NOTE — Telephone Encounter (Signed)
-----   Message from Anson FretAntonia B Ahern, MD sent at 04/08/2016  6:42 PM EDT ----- MRI is unramrkable thanks

## 2016-04-09 NOTE — Telephone Encounter (Signed)
Called and spoke to to patient about MRI results per Dr Lucia Gaskinsahern note. Reminded her of appt on 10/11 at 730am check in 715am. She verbalized understanding.

## 2016-04-17 ENCOUNTER — Encounter: Payer: Self-pay | Admitting: Neurology

## 2016-04-17 ENCOUNTER — Ambulatory Visit (INDEPENDENT_AMBULATORY_CARE_PROVIDER_SITE_OTHER): Payer: BLUE CROSS/BLUE SHIELD | Admitting: Neurology

## 2016-04-17 VITALS — BP 115/69 | HR 62 | Resp 16 | Ht 64.0 in | Wt 172.0 lb

## 2016-04-17 DIAGNOSIS — G43711 Chronic migraine without aura, intractable, with status migrainosus: Secondary | ICD-10-CM | POA: Diagnosis not present

## 2016-04-17 MED ORDER — PROCHLORPERAZINE MALEATE 10 MG PO TABS: ORAL_TABLET | ORAL | 1 refills | Status: DC

## 2016-04-17 MED ORDER — SUMATRIPTAN SUCCINATE 100 MG PO TABS: 100.0000 mg | ORAL_TABLET | Freq: Once | ORAL | 12 refills | Status: DC | PRN

## 2016-04-17 MED ORDER — TOPIRAMATE 25 MG PO TABS: 100.0000 mg | ORAL_TABLET | Freq: Every day | ORAL | 12 refills | Status: DC

## 2016-04-17 MED ORDER — TIZANIDINE HCL 4 MG PO TABS: 4.0000 mg | ORAL_TABLET | Freq: Three times a day (TID) | ORAL | 4 refills | Status: AC | PRN

## 2016-04-17 NOTE — Progress Notes (Signed)
[facility]    Provider:  Dr Lucia Gaskins Referring Provider: Catha Gosselin, MD Primary Care Physician:  Mickie Hillier, MD  CC:  headaches  Interval update. MRI normal, reviewed images with patient, discussed migraine management different medications and classes we use. Will increase Topiramate, start compazine and tizanidine for the next 7-10 days to see if we can help with her current continuous migraine, initiate imitrex and have her back for migraine cocktail. She will keep in touch with me via email.   HPI:  Kelly [name] is a 35 y.o. female here as a referral from Dr. Vincente Poli for headaches. Past medical history attention deficit disorder, other chronic pain, rheumatoid arthritis. Had her baby in March. Since May she has had daily headaches. Some days it gets so back that she can;t think and is confused. The headaches are across the top of the head. She has pain does the front of her head on the top of her head. It is pressure. It can feel sharp. It can be 8/10. No significant light or sound sensitivity. She has nause when it is severe. She has vision changes especially when it is hurting badly very blurry almost double vision. Continuous all day long. Waxes and wanes but she wake sup with it and she goes to bed with it. No inciting events. Tylenol, ibuprofen and alleve don't help she does not take these anymore. Nausea. Not vomiting. She has decreased concentration. Worse in the morning and at the end of the day positional. She was treated for a month of doxycycline for lyme in April. She is not nursing. She feels confused and decreased concentration when headache is severe. No other associated symptoms of modifying factors. There is increased stress with 2 kids. No FHx of migraines.   Tried: Amitriptyline and could not tolerate, tried steroids with severe joint pain  Reviewed notes, labs and imaging from outside physicians, which showed:  BUN 14, creatinine 0.8 to  01/05/2016   Social History   Social History  . Marital status: Married    Spouse name: Joey  . Number of children: 2  . Years of education: Bachelor's   Occupational History  . Not on file.   Social History Main Topics  . Smoking status: Never Smoker  . Smokeless tobacco: Never Used  . Alcohol use No  . Drug use: No  . Sexual activity: Yes   Other Topics Concern  . Not on file   Social History Narrative   Lives with husband, Joey   Caffeine use: Drinks 1 cup coffee per day   No tea or soda    Family History  Problem Relation Age of Onset  . Hypertension Mother   . Hypertension Father   . Hypertension Maternal Grandmother   . Hypertension Maternal Grandfather   . Hypertension Paternal Grandmother   . Hypertension Paternal Grandfather     Past Medical History:  Diagnosis Date  . ADD (attention deficit disorder)   . Anxiety   . Arthritis   . Asthma    exercise induced  . GERD (gastroesophageal reflux disease)    with pregnancy  . HSV-1 (herpes simplex virus 1) infection   . Hypertension    with 1st pregnancy  . IBS (irritable bowel syndrome)    Constipation  . 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 Osteonecrosis (HCC)   . Raynaud's disease     Past Surgical History:  Procedure Laterality Date  . CESAREAN SECTION N/A 07/19/2014  Procedure: CESAREAN SECTION;  Surgeon: Jeani Hawking, MD;  Location: [facility];  Service: Obstetrics;  Laterality: N/A;  . CESAREAN SECTION N/A 09/14/2015   Procedure: REPEAT CESAREAN SECTION;  Surgeon: Marcelle Overlie, MD;  Location: [facility];  Service: Obstetrics;  Laterality: N/A;  . JOINT REPLACEMENT     bilateral knees  . KNEE ARTHROSCOPY     Multiple  . REPLACEMENT TOTAL KNEE BILATERAL    . WISDOM TOOTH EXTRACTION      Current Outpatient Prescriptions  Medication Sig Dispense Refill  . acetaminophen (TYLENOL) 325 MG tablet Take 650 mg by mouth every 6 (six) hours as needed for mild pain.      Marland Kitchen ibuprofen (ADVIL,MOTRIN) 600 MG tablet Take 1 tablet (600 mg total) by mouth every 6 (six) hours. 60 tablet 0  . ketoprofen (ORUDIS) 75 MG capsule Take 75 mg by mouth 2 (two) times daily.  0  . Prenatal Vit-Fe Fumarate-FA (PRENATAL MULTIVITAMIN) TABS tablet Take 1 tablet by mouth daily.    Marland Kitchen topiramate (TOPAMAX) 25 MG tablet Take 25 mg by mouth daily.  1   No current facility-administered medications for this visit.     Allergies as of 04/17/2016 - Review Complete 04/17/2016  Allergen Reaction Noted  . Latex Itching 07/18/2014  . Prednisone Other (See Comments) 05/13/2014    Vitals: BP 115/69   Pulse 62   Resp 16   Ht 5\' 4"  (1.626 m)   Wt 78 kg (172 lb)   BMI 29.52 kg/m  Last Weight:  Wt Readings from Last 1 Encounters:  04/17/16 78 kg (172 lb)   Last Height:   Ht Readings from Last 1 Encounters:  04/17/16 5\' 4"  (1.626 m)    Physical exam: Exam: Gen: NAD, conversant, well nourised, well groomed                     CV: RRR, no MRG. No Carotid Bruits. No peripheral edema, warm, nontender Eyes: Conjunctivae clear without exudates or hemorrhage  Neuro: Detailed Neurologic Exam  Speech:    Speech is normal; fluent and spontaneous with normal comprehension.  Cognition:    The patient is oriented to person, place, and time;     recent and remote memory intact;     language fluent;     normal attention, concentration,     fund of knowledge Cranial Nerves:    The pupils are equal, round, and reactive to light. The fundi are normal and spontaneous venous pulsations are present. Visual fields are full to finger confrontation. Extraocular movements are intact. Trigeminal sensation is intact and the muscles of mastication are normal. The face is symmetric. The palate elevates in the midline. Hearing intact. Voice is normal. Shoulder shrug is normal. The tongue has normal motion without fasciculations.   Coordination:    Normal finger to nose and heel to shin. Normal  rapid alternating movements.   Gait:    Heel-toe and tandem gait are normal.   Motor Observation:    No asymmetry, no atrophy, and no involuntary movements noted. Tone:    Normal muscle tone.    Posture:    Posture is normal. normal erect    Strength:    Strength is V/V in the upper and lower limbs.      Sensation: intact to LT     Reflex Exam:  DTR's:    Deep tendon reflexes in the upper and lower extremities are normal bilaterally.   Toes:    The toes  are downgoing bilaterally.   Clonus:    Clonus is absent.      Assessment/Plan:  35 year old with chronic intractable daily headaches refractory with vision changes, nausea, positional developed after having her second child.  MRI of the brain w/wo contrast normal.  As far as your medications are concerned, I would like to suggest:  Tizanidine and compazine 3x a day for the next 7-10 days then as needed. Watch for sedation do not drive Imigrex at onset of migraine may repeat in 2 hours once if needed no more than 2x in a day or 2-3 days a week Increase Topamax by 25mg  at night every 3-5 days until taking 100mg  at night   I would like to see you back for migraine "cocktail", sooner if we need to. Please call us with any interim questions, concerns, problems, updates or refill requests.    Discussed: To prevent or relieve headaches, try the following:  Cool Compress. Lie down and place a cool compress on your head.   Avoid headache triggers. If certain foods or odors seem to have triggered your migraines in the past, avoid them. A headache diary might help you identify triggers.   Include physical activity in your daily routine. Try a daily walk or other moderate aerobic exercise.   Manage stress. Find healthy ways to cope with the stressors, such as delegating tasks on your to-do list.   Practice relaxation techniques. Try deep breathing, yoga, massage and visualization.   Eat regularly. Eating regularly  scheduled meals and maintaining a healthy diet might help prevent headaches. Also, drink plenty of fluids.   Follow a regular sleep schedule. Sleep deprivation might contribute to headaches  Consider biofeedback. With this mind-body technique, you learn to control certain bodily functions - such as muscle tension, heart rate and blood pressure - to prevent headaches or reduce headache pain.    Proceed to emergency room if you experience new or worsening symptoms or symptoms do not resolve, if you have new neurologic symptoms or if headache is severe, or for any concerning symptom.   Cc: Dr. Vincente PoliGrewal, Dr. Royston BakeLittle  Charlean Carneal, MD  [facility] [address] [place], [place] [id number]  Phone [phone] Fax [phone]  A total of 30 minutes was spent face-to-face with this patient. Over half this time was spent on counseling patient on the chronic migraines  diagnosis and different diagnostic and therapeutic options available.

## 2016-04-17 NOTE — Patient Instructions (Signed)
Remember to drink plenty of fluid, eat healthy meals and do not skip any meals. Try to eat protein with a every meal and eat a healthy snack such as fruit or nuts in between meals. Try to keep a regular sleep-wake schedule and try to exercise daily, particularly in the form of walking, 20-30 minutes a day, if you can.   As far as your medications are concerned, I would like to suggest:  Tizanidine and compazine 3x a day for the next 7-10 days then as needed. Watch for sedation do not drive Imigrex at onset of migraine may repeat in 2 hours once if needed no more than 2x in a day or 2-3 days a week Increase Topamax by 25mg  at night every 3-5 days until taking 100mg  at night   I would like to see you back for migraine "cocktail", sooner if we need to. Please call us with any interim questions, concerns, problems, updates or refill requests.   Our phone number is [phone]. We also have an after hours call service for urgent matters and there is a physician on-call for urgent questions. For any emergencies you know to call 911 or go to the nearest emergency room  Prochlorperazine tablets What is this medicine? PROCHLORPERAZINE (proe klor PER a zeen) helps to control severe nausea and vomiting. This medicine is also used to treat schizophrenia. It can also help patients who experience anxiety that is not due to psychological illness. This medicine may be used for other purposes; ask your health care provider or pharmacist if you have questions. What should I tell my health care provider before I take this medicine? They need to know if you have any of these conditions: -blood disorders or disease -dementia -liver disease or jaundice -Parkinson's disease -uncontrollable movement disorder -an unusual or allergic reaction to prochlorperazine, other medicines, foods, dyes, or preservatives -pregnant or trying to get pregnant -breast-feeding How should I use this medicine? Take this medicine by  mouth with a glass of water. Follow the directions on the prescription label. Take your doses at regular intervals. Do not take your medicine more often than directed. Do not stop taking this medicine suddenly. This can cause nausea, vomiting, and dizziness. Ask your doctor or health care professional for advice. Talk to your pediatrician regarding the use of this medicine in children. Special care may be needed. While this drug may be prescribed for children as young as 2 years for selected conditions, precautions do apply. Overdosage: If you think you have taken too much of this medicine contact a poison control center or emergency room at once. NOTE: This medicine is only for you. Do not share this medicine with others. What if I miss a dose? If you miss a dose, take it as soon as you can. If it is almost time for your next dose, take only that dose. Do not take double or extra doses. What may interact with this medicine? Do not take this medicine with any of the following medications: -amoxapine -antidepressants like citalopram, escitalopram, fluoxetine, paroxetine, and sertraline -deferoxamine -dofetilide -maprotiline -tricyclic antidepressants like amitriptyline, clomipramine, imipramine, nortiptyline and others This medicine may also interact with the following medications: -lithium -medicines for pain -phenytoin -propranolol -warfarin This list may not describe all possible interactions. Give your health care provider a list of all the medicines, herbs, non-prescription drugs, or dietary supplements you use. Also tell them if you smoke, drink alcohol, or use illegal drugs. Some items may interact with your medicine. What  should I watch for while using this medicine? Visit your doctor or health care professional for regular checks on your progress. You may get drowsy or dizzy. Do not drive, use machinery, or do anything that needs mental alertness until you know how this medicine  affects you. Do not stand or sit up quickly, especially if you are an older patient. This reduces the risk of dizzy or fainting spells. Alcohol may interfere with the effect of this medicine. Avoid alcoholic drinks. This medicine can reduce the response of your body to heat or cold. Dress warm in cold weather and stay hydrated in hot weather. If possible, avoid extreme temperatures like saunas, hot tubs, very hot or cold showers, or activities that can cause dehydration such as vigorous exercise. This medicine can make you more sensitive to the sun. Keep out of the sun. If you cannot avoid being in the sun, wear protective clothing and use sunscreen. Do not use sun lamps or tanning beds/booths. Your mouth may get dry. Chewing sugarless gum or sucking hard candy, and drinking plenty of water may help. Contact your doctor if the problem does not go away or is severe. What side effects may I notice from receiving this medicine? Side effects that you should report to your doctor or health care professional as soon as possible: -blurred vision -breast enlargement in men or women -breast milk in women who are not breast-feeding -chest pain, fast or irregular heartbeat -confusion, restlessness -dark yellow or brown urine -difficulty breathing or swallowing -dizziness or fainting spells -drooling, shaking, movement difficulty (shuffling walk) or rigidity -fever, chills, sore throat -involuntary or uncontrollable movements of the eyes, mouth, head, arms, and legs -seizures -stomach area pain -unusually weak or tired -unusual bleeding or bruising -yellowing of skin or eyes Side effects that usually do not require medical attention (report to your doctor or health care professional if they continue or are bothersome): -difficulty passing urine -difficulty sleeping -headache -sexual dysfunction -skin rash, or itching This list may not describe all possible side effects. Call your doctor for medical  advice about side effects. You may report side effects to FDA at 1-800-FDA-1088. Where should I keep my medicine? Keep out of the reach of children. Store at room temperature between 15 and 30 degrees C (59 and 86 degrees F). Protect from light. Throw away any unused medicine after the expiration date. NOTE: This sheet is a summary. It may not cover all possible information. If you have questions about this medicine, talk to your doctor, pharmacist, or health care provider.    2016, [employer]. (2011-11-12 16:59:39)   Sumatriptan tablets What is this medicine? SUMATRIPTAN (soo ma TRIP tan) is used to treat migraines with or without aura. An aura is a strange feeling or visual disturbance that warns you of an attack. It is not used to prevent migraines. This medicine may be used for other purposes; ask your health care provider or pharmacist if you have questions. What should I tell my health care provider before I take this medicine? They need to know if you have any of these conditions: -circulation problems in fingers and toes -diabetes -heart disease -high blood pressure -high cholesterol -history of irregular heartbeat -history of stroke -kidney disease -liver disease -postmenopausal or surgical removal of uterus and ovaries -seizures -smoke tobacco -stomach or intestine problems -an unusual or allergic reaction to sumatriptan, other medicines, foods, dyes, or preservatives -pregnant or trying to get pregnant -breast-feeding How should I use this medicine? Take this  medicine by mouth with a glass of water. Follow the directions on the prescription label. This medicine is taken at the first symptoms of a migraine. It is not for everyday use. If your migraine headache returns after one dose, you can take another dose as directed. You must leave at least 2 hours between doses, and do not take more than 100 mg as a single dose. Do not take more than 200 mg total in any 24  hour period. If there is no improvement at all after the first dose, do not take a second dose without talking to your doctor or health care professional. Do not take your medicine more often than directed. Talk to your pediatrician regarding the use of this medicine in children. Special care may be needed. Overdosage: If you think you have taken too much of this medicine contact a poison control center or emergency room at once. NOTE: This medicine is only for you. Do not share this medicine with others. What if I miss a dose? This does not apply; this medicine is not for regular use. What may interact with this medicine? Do not take this medicine with any of the following medicines: -cocaine -ergot alkaloids like dihydroergotamine, ergonovine, ergotamine, methylergonovine -feverfew -MAOIs like Carbex, Eldepryl, Marplan, Nardil, and Parnate -other medicines for migraine headache like almotriptan, eletriptan, frovatriptan, naratriptan, rizatriptan, zolmitriptan -tryptophan This medicine may also interact with the following medications: -certain medicines for depression, anxiety, or psychotic disturbances This list may not describe all possible interactions. Give your health care provider a list of all the medicines, herbs, non-prescription drugs, or dietary supplements you use. Also tell them if you smoke, drink alcohol, or use illegal drugs. Some items may interact with your medicine. What should I watch for while using this medicine? Only take this medicine for a migraine headache. Take it if you get warning symptoms or at the start of a migraine attack. It is not for regular use to prevent migraine attacks. You may get drowsy or dizzy. Do not drive, use machinery, or do anything that needs mental alertness until you know how this medicine affects you. To reduce dizzy or fainting spells, do not sit or stand up quickly, especially if you are an older patient. Alcohol can increase drowsiness,  dizziness and flushing. Avoid alcoholic drinks. Smoking cigarettes may increase the risk of heart-related side effects from using this medicine. If you take migraine medicines for 10 or more days a month, your migraines may get worse. Keep a diary of headache days and medicine use. Contact your healthcare professional if your migraine attacks occur more frequently. What side effects may I notice from receiving this medicine? Side effects that you should report to your doctor or health care professional as soon as possible: -allergic reactions like skin rash, itching or hives, swelling of the face, lips, or tongue -bloody or watery diarrhea -hallucination, loss of contact with reality -pain, tingling, numbness in the face, hands, or feet -seizures -signs and symptoms of a blood clot such as breathing problems; changes in vision; chest pain; severe, sudden headache; pain, swelling, warmth in the leg; trouble speaking; sudden numbness or weakness of the face, arm, or leg -signs and symptoms of a dangerous change in heartbeat or heart rhythm like chest pain; dizziness; fast or irregular heartbeat; palpitations, feeling faint or lightheaded; falls; breathing problems -signs and symptoms of a stroke like changes in vision; confusion; trouble speaking or understanding; severe headaches; sudden numbness or weakness of the face, arm, or  leg; trouble walking; dizziness; loss of balance or coordination -stomach pain Side effects that usually do not require medical attention (report these to your doctor or health care professional if they continue or are bothersome): -changes in taste -facial flushing -headache -muscle cramps -muscle pain -nausea, vomiting -weak or tired This list may not describe all possible side effects. Call your doctor for medical advice about side effects. You may report side effects to FDA at 1-800-FDA-1088. Where should I keep my medicine? Keep out of the reach of  children. Store at room temperature between 2 and 30 degrees C (36 and 86 degrees F). Throw away any unused medicine after the expiration date. NOTE: This sheet is a summary. It may not cover all possible information. If you have questions about this medicine, talk to your doctor, pharmacist, or health care provider.    2016, [employer] Standard. (2014-12-29 17:46:40)   Tizanidine tablets or capsules What is this medicine? TIZANIDINE (tye ZAN i deen) helps to relieve muscle spasms. It may be used to help in the treatment of multiple sclerosis and spinal cord injury. This medicine may be used for other purposes; ask your health care provider or pharmacist if you have questions. What should I tell my health care provider before I take this medicine? They need to know if you have any of these conditions: -kidney disease -liver disease -low blood pressure -mental disorder -an unusual or allergic reaction to tizanidine, other medicines, lactose (tablets only), foods, dyes, or preservatives -pregnant or trying to get pregnant -breast-feeding How should I use this medicine? Take this medicine by mouth with a full glass of water. Take this medicine on an empty stomach, at least 30 minutes before or 2 hours after food. Do not take with food unless you talk with your doctor. Follow the directions on the prescription label. Take your medicine at regular intervals. Do not take your medicine more often than directed. Do not stop taking except on your doctor's advice. Suddenly stopping the medicine can be very dangerous. Talk to your pediatrician regarding the use of this medicine in children. Patients over 79 years old may have a stronger reaction and need a smaller dose. Overdosage: If you think you have taken too much of this medicine contact a poison control center or emergency room at once. NOTE: This medicine is only for you. Do not share this medicine with others. What if I miss a dose? If  you miss a dose, take it as soon as you can. If it is almost time for your next dose, take only that dose. Do not take double or extra doses. What may interact with this medicine? Do not take this medicine with any of the following medications: -ciprofloxacin -clonidine -fluvoxamine -guanabenz -guanfacine -methyldopa This medicine may also interact with the following medications: -acyclovir -alcohol -antihistamines -baclofen -barbiturates like phenobarbital -benzodiazepines -cimetidine -famotidine -female hormones, like estrogens or progestins and birth control pills -medicines for high blood pressure -medicines for irregular heartbeat -medicines for pain like codeine, morphine, and hydrocodone -medicines for sleep -rofecoxib -some antibiotics like levofloxacin, ofloxacin -ticlopidine -zileuton This list may not describe all possible interactions. Give your health care provider a list of all the medicines, herbs, non-prescription drugs, or dietary supplements you use. Also tell them if you smoke, drink alcohol, or use illegal drugs. Some items may interact with your medicine. What should I watch for while using this medicine? You may get drowsy or dizzy. Do not drive, use machinery, or do anything that needs  mental alertness until you know how this medicine affects you. Do not stand or sit up quickly, especially if you are an older patient. This reduces the risk of dizzy or fainting spells. Alcohol may interfere with the effect of this medicine. Avoid alcoholic drinks. Your mouth may get dry. Chewing sugarless gum or sucking hard candy, and drinking plenty of water may help. Contact your doctor if the problem does not go away or is severe. What side effects may I notice from receiving this medicine? Side effects that you should report to your doctor or health care professional as soon as possible: -allergic reactions like skin rash, itching or hives, swelling of the face, lips, or  tongue -blurred vision -fainting spells -hallucinations -nausea or vomiting -nervousness -redness, blistering, peeling or loosening of the skin, including inside the mouth -slow or irregular heartbeat, palpitations, or chest pain -yellowing of the skin or eyes Side effects that usually do not require medical attention (report to your doctor or health care professional if they continue or are bothersome): -dizziness -drowsiness -dry mouth -tiredness or weakness This list may not describe all possible side effects. Call your doctor for medical advice about side effects. You may report side effects to FDA at 1-800-FDA-1088. Where should I keep my medicine? Keep out of the reach of children. Store at room temperature between 15 and 30 degrees C (59 and 86 degrees F). Throw away any unused medicine after the expiration date. NOTE: This sheet is a summary. It may not cover all possible information. If you have questions about this medicine, talk to your doctor, pharmacist, or health care provider.    2016, [employer] Standard. (2008-03-10 12:38:02)  Topiramate tablets What is this medicine? TOPIRAMATE (toe PYRE a mate) is used to treat seizures in adults or children with epilepsy. It is also used for the prevention of migraine headaches. This medicine may be used for other purposes; ask your health care provider or pharmacist if you have questions. What should I tell my health care provider before I take this medicine? They need to know if you have any of these conditions: -bleeding disorders -cirrhosis of the liver or liver disease -diarrhea -glaucoma -kidney stones or kidney disease -low blood counts, like low white cell, platelet, or red cell counts -lung disease like asthma, obstructive pulmonary disease, emphysema -metabolic acidosis -on a ketogenic diet -schedule for surgery or a procedure -suicidal thoughts, plans, or attempt; a previous suicide attempt by you or a family  member -an unusual or allergic reaction to topiramate, other medicines, foods, dyes, or preservatives -pregnant or trying to get pregnant -breast-feeding How should I use this medicine? Take this medicine by mouth with a glass of water. Follow the directions on the prescription label. Do not crush or chew. You may take this medicine with meals. Take your medicine at regular intervals. Do not take it more often than directed. Talk to your pediatrician regarding the use of this medicine in children. Special care may be needed. While this drug may be prescribed for children as young as 152 years of age for selected conditions, precautions do apply. Overdosage: If you think you have taken too much of this medicine contact a poison control center or emergency room at once. NOTE: This medicine is only for you. Do not share this medicine with others. What if I miss a dose? If you miss a dose, take it as soon as you can. If your next dose is to be taken in less than 6 hours,  then do not take the missed dose. Take the next dose at your regular time. Do not take double or extra doses. What may interact with this medicine? Do not take this medicine with any of the following medications: -probenecid This medicine may also interact with the following medications: -acetazolamide -alcohol -amitriptyline -aspirin and aspirin-like medicines -birth control pills -certain medicines for depression -certain medicines for seizures -certain medicines that treat or prevent blood clots like warfarin, enoxaparin, dalteparin, apixaban, dabigatran, and rivaroxaban -digoxin -hydrochlorothiazide -lithium -medicines for pain, sleep, or muscle relaxation -metformin -methazolamide -NSAIDS, medicines for pain and inflammation, like ibuprofen or naproxen -pioglitazone -risperidone This list may not describe all possible interactions. Give your health care provider a list of all the medicines, herbs, non-prescription  drugs, or dietary supplements you use. Also tell them if you smoke, drink alcohol, or use illegal drugs. Some items may interact with your medicine. What should I watch for while using this medicine? Visit your doctor or health care professional for regular checks on your progress. Do not stop taking this medicine suddenly. This increases the risk of seizures if you are using this medicine to control epilepsy. Wear a medical identification bracelet or chain to say you have epilepsy or seizures, and carry a card that lists all your medicines. This medicine can decrease sweating and increase your body temperature. Watch for signs of deceased sweating or fever, especially in children. Avoid extreme heat, hot baths, and saunas. Be careful about exercising, especially in hot weather. Contact your health care provider right away if you notice a fever or decrease in sweating. You should drink plenty of fluids while taking this medicine. If you have had kidney stones in the past, this will help to reduce your chances of forming kidney stones. If you have stomach pain, with nausea or vomiting and yellowing of your eyes or skin, call your doctor immediately. You may get drowsy, dizzy, or have blurred vision. Do not drive, use machinery, or do anything that needs mental alertness until you know how this medicine affects you. To reduce dizziness, do not sit or stand up quickly, especially if you are an older patient. Alcohol can increase drowsiness and dizziness. Avoid alcoholic drinks. If you notice blurred vision, eye pain, or other eye problems, seek medical attention at once for an eye exam. The use of this medicine may increase the chance of suicidal thoughts or actions. Pay special attention to how you are responding while on this medicine. Any worsening of mood, or thoughts of suicide or dying should be reported to your health care professional right away. This medicine may increase the chance of developing  metabolic acidosis. If left untreated, this can cause kidney stones, bone disease, or slowed growth in children. Symptoms include breathing fast, fatigue, loss of appetite, irregular heartbeat, or loss of consciousness. Call your doctor immediately if you experience any of these side effects. Also, tell your doctor about any surgery you plan on having while taking this medicine since this may increase your risk for metabolic acidosis. Birth control pills may not work properly while you are taking this medicine. Talk to your doctor about using an extra method of birth control. Women who become pregnant while using this medicine may enroll in the [place] [employer] Antiepileptic Drug Pregnancy Registry by calling [phone]. This registry collects information about the safety of antiepileptic drug use during pregnancy. What side effects may I notice from receiving this medicine? Side effects that you should report to your doctor or health  care professional as soon as possible: -allergic reactions like skin rash, itching or hives, swelling of the face, lips, or tongue -decreased sweating and/or rise in body temperature -depression -difficulty breathing, fast or irregular breathing patterns -difficulty speaking -difficulty walking or controlling muscle movements -hearing impairment -redness, blistering, peeling or loosening of the skin, including inside the mouth -tingling, pain or numbness in the hands or feet -unusual bleeding or bruising -unusually weak or tired -worsening of mood, thoughts or actions of suicide or dying Side effects that usually do not require medical attention (report to your doctor or health care professional if they continue or are bothersome): -altered taste -back pain, joint or muscle aches and pains -diarrhea, or constipation -headache -loss of appetite -nausea -stomach upset, indigestion -tremors This list may not describe all possible side effects. Call your doctor  for medical advice about side effects. You may report side effects to FDA at 1-800-FDA-1088. Where should I keep my medicine? Keep out of the reach of children. Store at room temperature between 15 and 30 degrees C (59 and 86 degrees F) in a tightly closed container. Protect from moisture. Throw away any unused medicine after the expiration date. NOTE: This sheet is a summary. It may not cover all possible information. If you have questions about this medicine, talk to your doctor, pharmacist, or health care provider.    2016, [employer] Standard. (2013-06-28 23:17:57)

## 2016-04-24 ENCOUNTER — Ambulatory Visit: Payer: BLUE CROSS/BLUE SHIELD | Admitting: Neurology

## 2018-06-24 DIAGNOSIS — R05 Cough: Secondary | ICD-10-CM | POA: Diagnosis not present

## 2018-06-24 DIAGNOSIS — J019 Acute sinusitis, unspecified: Secondary | ICD-10-CM | POA: Diagnosis not present

## 2019-02-11 DIAGNOSIS — N390 Urinary tract infection, site not specified: Secondary | ICD-10-CM | POA: Diagnosis not present

## 2019-02-11 DIAGNOSIS — Z6824 Body mass index (BMI) 24.0-24.9, adult: Secondary | ICD-10-CM | POA: Diagnosis not present

## 2019-02-11 DIAGNOSIS — Z01419 Encounter for gynecological examination (general) (routine) without abnormal findings: Secondary | ICD-10-CM | POA: Diagnosis not present

## 2019-07-14 ENCOUNTER — Ambulatory Visit: Payer: BC Managed Care – PPO | Attending: Internal Medicine

## 2019-07-20 ENCOUNTER — Ambulatory Visit: Payer: BC Managed Care – PPO | Attending: Internal Medicine

## 2019-07-20 ENCOUNTER — Other Ambulatory Visit: Payer: Self-pay

## 2019-07-20 DIAGNOSIS — Z20822 Contact with and (suspected) exposure to covid-19: Secondary | ICD-10-CM | POA: Diagnosis not present

## 2019-07-22 DIAGNOSIS — R300 Dysuria: Secondary | ICD-10-CM | POA: Diagnosis not present

## 2019-07-22 DIAGNOSIS — Z3201 Encounter for pregnancy test, result positive: Secondary | ICD-10-CM | POA: Diagnosis not present

## 2019-07-22 DIAGNOSIS — N760 Acute vaginitis: Secondary | ICD-10-CM | POA: Diagnosis not present

## 2019-07-22 DIAGNOSIS — N912 Amenorrhea, unspecified: Secondary | ICD-10-CM | POA: Diagnosis not present

## 2019-07-22 DIAGNOSIS — Z113 Encounter for screening for infections with a predominantly sexual mode of transmission: Secondary | ICD-10-CM | POA: Diagnosis not present

## 2019-07-22 DIAGNOSIS — R309 Painful micturition, unspecified: Secondary | ICD-10-CM | POA: Diagnosis not present

## 2019-07-22 LAB — NOVEL CORONAVIRUS, NAA: SARS-CoV-2, NAA: NOT DETECTED

## 2019-07-26 DIAGNOSIS — N912 Amenorrhea, unspecified: Secondary | ICD-10-CM | POA: Diagnosis not present

## 2019-08-19 DIAGNOSIS — N911 Secondary amenorrhea: Secondary | ICD-10-CM | POA: Diagnosis not present

## 2019-08-26 DIAGNOSIS — Z23 Encounter for immunization: Secondary | ICD-10-CM | POA: Diagnosis not present

## 2019-08-26 DIAGNOSIS — Z3480 Encounter for supervision of other normal pregnancy, unspecified trimester: Secondary | ICD-10-CM | POA: Diagnosis not present

## 2019-08-26 DIAGNOSIS — Z3685 Encounter for antenatal screening for Streptococcus B: Secondary | ICD-10-CM | POA: Diagnosis not present

## 2019-08-26 DIAGNOSIS — Z3481 Encounter for supervision of other normal pregnancy, first trimester: Secondary | ICD-10-CM | POA: Diagnosis not present

## 2019-09-09 DIAGNOSIS — Z3481 Encounter for supervision of other normal pregnancy, first trimester: Secondary | ICD-10-CM | POA: Diagnosis not present

## 2019-09-09 DIAGNOSIS — Z113 Encounter for screening for infections with a predominantly sexual mode of transmission: Secondary | ICD-10-CM | POA: Diagnosis not present

## 2019-09-23 DIAGNOSIS — Z3A12 12 weeks gestation of pregnancy: Secondary | ICD-10-CM | POA: Diagnosis not present

## 2019-09-23 DIAGNOSIS — Z3682 Encounter for antenatal screening for nuchal translucency: Secondary | ICD-10-CM | POA: Diagnosis not present

## 2019-09-23 DIAGNOSIS — Z139 Encounter for screening, unspecified: Secondary | ICD-10-CM | POA: Diagnosis not present

## 2019-09-23 DIAGNOSIS — Z3481 Encounter for supervision of other normal pregnancy, first trimester: Secondary | ICD-10-CM | POA: Diagnosis not present

## 2019-09-27 DIAGNOSIS — R0981 Nasal congestion: Secondary | ICD-10-CM | POA: Diagnosis not present

## 2019-09-27 DIAGNOSIS — R05 Cough: Secondary | ICD-10-CM | POA: Diagnosis not present

## 2019-11-09 DIAGNOSIS — Z3482 Encounter for supervision of other normal pregnancy, second trimester: Secondary | ICD-10-CM | POA: Diagnosis not present

## 2019-11-09 DIAGNOSIS — Z3483 Encounter for supervision of other normal pregnancy, third trimester: Secondary | ICD-10-CM | POA: Diagnosis not present

## 2019-11-15 DIAGNOSIS — Z3A19 19 weeks gestation of pregnancy: Secondary | ICD-10-CM | POA: Diagnosis not present

## 2019-11-15 DIAGNOSIS — O09522 Supervision of elderly multigravida, second trimester: Secondary | ICD-10-CM | POA: Diagnosis not present

## 2019-11-15 DIAGNOSIS — Z363 Encounter for antenatal screening for malformations: Secondary | ICD-10-CM | POA: Diagnosis not present

## 2019-11-17 ENCOUNTER — Inpatient Hospital Stay (HOSPITAL_COMMUNITY): Payer: BC Managed Care – PPO | Admitting: Anesthesiology

## 2019-11-17 ENCOUNTER — Inpatient Hospital Stay (HOSPITAL_COMMUNITY)
Admission: AD | Admit: 2019-11-17 | Discharge: 2019-11-20 | DRG: 806 | Disposition: A | Discharge status: Home / Self Care | Payer: BC Managed Care – PPO | Attending: Obstetrics and Gynecology | Admitting: Obstetrics and Gynecology

## 2019-11-17 ENCOUNTER — Encounter (HOSPITAL_COMMUNITY): Payer: Self-pay | Admitting: Obstetrics and Gynecology

## 2019-11-17 ENCOUNTER — Other Ambulatory Visit: Payer: Self-pay

## 2019-11-17 DIAGNOSIS — O99344 Other mental disorders complicating childbirth: Secondary | ICD-10-CM | POA: Diagnosis not present

## 2019-11-17 DIAGNOSIS — O321XX0 Maternal care for breech presentation, not applicable or unspecified: Secondary | ICD-10-CM | POA: Diagnosis not present

## 2019-11-17 DIAGNOSIS — F419 Anxiety disorder, unspecified: Secondary | ICD-10-CM | POA: Diagnosis not present

## 2019-11-17 DIAGNOSIS — R600 Localized edema: Secondary | ICD-10-CM | POA: Diagnosis not present

## 2019-11-17 DIAGNOSIS — O9952 Diseases of the respiratory system complicating childbirth: Secondary | ICD-10-CM | POA: Diagnosis not present

## 2019-11-17 DIAGNOSIS — M79604 Pain in right leg: Secondary | ICD-10-CM | POA: Diagnosis not present

## 2019-11-17 DIAGNOSIS — Z20822 Contact with and (suspected) exposure to covid-19: Secondary | ICD-10-CM | POA: Diagnosis present

## 2019-11-17 DIAGNOSIS — O021 Missed abortion: Secondary | ICD-10-CM | POA: Diagnosis not present

## 2019-11-17 DIAGNOSIS — F4321 Adjustment disorder with depressed mood: Secondary | ICD-10-CM | POA: Diagnosis not present

## 2019-11-17 DIAGNOSIS — O34211 Maternal care for low transverse scar from previous cesarean delivery: Secondary | ICD-10-CM | POA: Diagnosis not present

## 2019-11-17 DIAGNOSIS — J45909 Unspecified asthma, uncomplicated: Secondary | ICD-10-CM | POA: Diagnosis not present

## 2019-11-17 DIAGNOSIS — M879 Osteonecrosis, unspecified: Secondary | ICD-10-CM | POA: Diagnosis present

## 2019-11-17 DIAGNOSIS — O99893 Other specified diseases and conditions complicating puerperium: Secondary | ICD-10-CM | POA: Diagnosis not present

## 2019-11-17 DIAGNOSIS — Z3A20 20 weeks gestation of pregnancy: Secondary | ICD-10-CM | POA: Diagnosis not present

## 2019-11-17 DIAGNOSIS — O364XX0 Maternal care for intrauterine death, not applicable or unspecified: Principal | ICD-10-CM | POA: Diagnosis present

## 2019-11-17 DIAGNOSIS — O34219 Maternal care for unspecified type scar from previous cesarean delivery: Secondary | ICD-10-CM | POA: Diagnosis present

## 2019-11-17 DIAGNOSIS — M25551 Pain in right hip: Secondary | ICD-10-CM | POA: Diagnosis not present

## 2019-11-17 DIAGNOSIS — O134 Gestational [pregnancy-induced] hypertension without significant proteinuria, complicating childbirth: Secondary | ICD-10-CM | POA: Diagnosis not present

## 2019-11-17 DIAGNOSIS — O43892 Other placental disorders, second trimester: Secondary | ICD-10-CM | POA: Diagnosis not present

## 2019-11-17 DIAGNOSIS — R200 Anesthesia of skin: Secondary | ICD-10-CM | POA: Diagnosis not present

## 2019-11-17 DIAGNOSIS — M7631 Iliotibial band syndrome, right leg: Secondary | ICD-10-CM | POA: Diagnosis not present

## 2019-11-17 LAB — CBC
HCT: 36.4 % (ref 36.0–46.0)
Hemoglobin: 12.2 g/dL (ref 12.0–15.0)
MCH: 28.9 pg (ref 26.0–34.0)
MCHC: 33.5 g/dL (ref 30.0–36.0)
MCV: 86.3 fL (ref 80.0–100.0)
Platelets: 327 10*3/uL (ref 150–400)
RBC: 4.22 MIL/uL (ref 3.87–5.11)
RDW: 12.6 % (ref 11.5–15.5)
WBC: 19.8 10*3/uL — ABNORMAL HIGH (ref 4.0–10.5)
nRBC: 0 % (ref 0.0–0.2)

## 2019-11-17 LAB — PROTIME-INR
INR: 1 (ref 0.8–1.2)
Prothrombin Time: 13.1 seconds (ref 11.4–15.2)

## 2019-11-17 LAB — KLEIHAUER-BETKE STAIN
# Vials RhIg: 1
Fetal Cells %: 0 %
Quantitation Fetal Hemoglobin: 0 mL

## 2019-11-17 LAB — SARS CORONAVIRUS 2 BY RT PCR (HOSPITAL ORDER, PERFORMED IN ~~LOC~~ HOSPITAL LAB): SARS Coronavirus 2: NEGATIVE

## 2019-11-17 LAB — TYPE AND SCREEN
ABO/RH(D): AB POS
Antibody Screen: NEGATIVE

## 2019-11-17 LAB — ABO/RH: ABO/RH(D): AB POS

## 2019-11-17 MED ORDER — SODIUM CHLORIDE (PF) 0.9 % IJ SOLN
INTRAMUSCULAR | Status: DC | PRN
  Administered 2019-11-17: 12 mL/h via EPIDURAL

## 2019-11-17 MED ORDER — OXYCODONE-ACETAMINOPHEN 5-325 MG PO TABS: 1.0000 | ORAL_TABLET | ORAL | Status: DC | PRN

## 2019-11-17 MED ORDER — PHENYLEPHRINE 40 MCG/ML (10ML) SYRINGE FOR IV PUSH (FOR BLOOD PRESSURE SUPPORT)
80.0000 ug | PREFILLED_SYRINGE | INTRAVENOUS | Status: DC | PRN
  Filled 2019-11-17: qty 10

## 2019-11-17 MED ORDER — MISOPROSTOL 200 MCG PO TABS
200.0000 ug | ORAL_TABLET | ORAL | Status: DC
  Administered 2019-11-17 – 2019-11-18 (×4): 200 ug via VAGINAL
  Filled 2019-11-17 (×4): qty 1

## 2019-11-17 MED ORDER — LACTATED RINGERS IV SOLN: INTRAVENOUS | Status: DC

## 2019-11-17 MED ORDER — OXYTOCIN BOLUS FROM INFUSION
500.0000 mL | Freq: Once | INTRAVENOUS | Status: AC
  Administered 2019-11-19: 500 mL via INTRAVENOUS

## 2019-11-17 MED ORDER — ACETAMINOPHEN 325 MG PO TABS
650.0000 mg | ORAL_TABLET | ORAL | Status: DC | PRN
  Administered 2019-11-17 – 2019-11-18 (×2): 650 mg via ORAL
  Filled 2019-11-17 (×2): qty 2

## 2019-11-17 MED ORDER — DIPHENHYDRAMINE HCL 50 MG/ML IJ SOLN: 12.5000 mg | INTRAMUSCULAR | Status: DC | PRN

## 2019-11-17 MED ORDER — LIDOCAINE HCL (PF) 1 % IJ SOLN: 30.0000 mL | INTRAMUSCULAR | Status: DC | PRN

## 2019-11-17 MED ORDER — OXYCODONE-ACETAMINOPHEN 5-325 MG PO TABS: 2.0000 | ORAL_TABLET | ORAL | Status: DC | PRN

## 2019-11-17 MED ORDER — HYDROXYZINE HCL 50 MG PO TABS
50.0000 mg | ORAL_TABLET | Freq: Four times a day (QID) | ORAL | Status: DC | PRN
  Administered 2019-11-17 – 2019-11-19 (×5): 50 mg via ORAL
  Filled 2019-11-17 (×5): qty 1

## 2019-11-17 MED ORDER — PHENYLEPHRINE 40 MCG/ML (10ML) SYRINGE FOR IV PUSH (FOR BLOOD PRESSURE SUPPORT): 80.0000 ug | PREFILLED_SYRINGE | INTRAVENOUS | Status: DC | PRN

## 2019-11-17 MED ORDER — SOD CITRATE-CITRIC ACID 500-334 MG/5ML PO SOLN: 30.0000 mL | ORAL | Status: DC | PRN

## 2019-11-17 MED ORDER — ONDANSETRON HCL 4 MG/2ML IJ SOLN: 4.0000 mg | Freq: Four times a day (QID) | INTRAMUSCULAR | Status: DC | PRN

## 2019-11-17 MED ORDER — OXYTOCIN 40 UNITS IN NORMAL SALINE INFUSION - SIMPLE MED
2.5000 [IU]/h | INTRAVENOUS | Status: DC
  Filled 2019-11-17: qty 1000

## 2019-11-17 MED ORDER — FENTANYL-BUPIVACAINE-NACL 0.5-0.125-0.9 MG/250ML-% EP SOLN
12.0000 mL/h | EPIDURAL | Status: DC | PRN
  Administered 2019-11-18 – 2019-11-19 (×2): 12 mL/h via EPIDURAL
  Filled 2019-11-17 (×3): qty 250

## 2019-11-17 MED ORDER — LACTATED RINGERS IV SOLN: 500.0000 mL | INTRAVENOUS | Status: DC | PRN

## 2019-11-17 MED ORDER — EPHEDRINE 5 MG/ML INJ: 10.0000 mg | INTRAVENOUS | Status: DC | PRN

## 2019-11-17 MED ORDER — BUTORPHANOL TARTRATE 1 MG/ML IJ SOLN: 1.0000 mg | INTRAMUSCULAR | Status: DC | PRN

## 2019-11-17 MED ORDER — LACTATED RINGERS IV SOLN
500.0000 mL | Freq: Once | INTRAVENOUS | Status: AC
  Administered 2019-11-17: 500 mL via INTRAVENOUS

## 2019-11-17 MED ORDER — ZOLPIDEM TARTRATE 5 MG PO TABS
5.0000 mg | ORAL_TABLET | Freq: Every evening | ORAL | Status: DC | PRN
  Administered 2019-11-17 – 2019-11-18 (×3): 5 mg via ORAL
  Filled 2019-11-17 (×3): qty 1

## 2019-11-17 MED ORDER — ALPRAZOLAM 0.25 MG PO TABS
0.2500 mg | ORAL_TABLET | Freq: Three times a day (TID) | ORAL | Status: DC | PRN
  Administered 2019-11-17 – 2019-11-19 (×4): 0.25 mg via ORAL
  Filled 2019-11-17 (×4): qty 1

## 2019-11-17 MED ORDER — LIDOCAINE HCL (PF) 1 % IJ SOLN
INTRAMUSCULAR | Status: DC | PRN
  Administered 2019-11-17 (×2): 4 mL via EPIDURAL

## 2019-11-17 NOTE — Progress Notes (Signed)
The chaplain visited and they felt a little better after that visit. They have decided to name their baby Tania Ade. They would like to hold him. Kiarrah and her husband are still understandably very tearful. We again discussed the plan and all of their questions were answered. First dose of cytotec was placed and CLE Is planned soon. I reiterated that this was not their fault and they understand.     Rosie Fate MD

## 2019-11-17 NOTE — Anesthesia Procedure Notes (Signed)
Epidural Patient location during procedure: OB  Staffing Anesthesiologist: Lewie Loron, MD Performed: anesthesiologist   Preanesthetic Checklist Completed: patient identified, IV checked, risks and benefits discussed, monitors and equipment checked, pre-op evaluation and timeout performed  Epidural Patient position: sitting Prep: DuraPrep and site prepped and draped Patient monitoring: heart rate, blood pressure and continuous pulse ox Approach: midline Location: L3-L4 Injection technique: LOR air and LOR saline  Needle:  Needle type: Tuohy  Needle gauge: 17 G Needle length: 9 cm Needle insertion depth: 5 cm Catheter type: closed end flexible Catheter size: 19 Gauge Catheter at skin depth: 10 cm Test dose: negative  Assessment Sensory level: T8 Events: blood not aspirated, injection not painful, no injection resistance, no paresthesia and negative IV test  Additional Notes Reason for block:procedure for pain

## 2019-11-17 NOTE — Anesthesia Preprocedure Evaluation (Signed)
Anesthesia Evaluation  Patient identified by MRN, date of birth, ID band Patient awake    Reviewed: Allergy & Precautions, Patient's Chart, lab work & pertinent test results  Airway Mallampati: II   Neck ROM: Full    Dental  (+) Teeth Intact, Dental Advisory Given   Pulmonary asthma ,    breath sounds clear to auscultation       Cardiovascular hypertension,  Rhythm:Regular     Neuro/Psych PSYCHIATRIC DISORDERS Anxiety    GI/Hepatic Neg liver ROS, GERD  ,  Endo/Other  negative endocrine ROS  Renal/GU negative Renal ROS     Musculoskeletal   Abdominal   Peds  Hematology   Anesthesia Other Findings   Reproductive/Obstetrics (+) Pregnancy Gest HTN                             Anesthesia Physical  Anesthesia Plan  ASA: II  Anesthesia Plan: Epidural   Post-op Pain Management:    Induction:   PONV Risk Score and Plan:   Airway Management Planned:   Additional Equipment:   Intra-op Plan:   Post-operative Plan:   Informed Consent: I have reviewed the patients History and Physical, chart, labs and discussed the procedure including the risks, benefits and alternatives for the proposed anesthesia with the patient or authorized representative who has indicated his/her understanding and acceptance.       Plan Discussed with:   Anesthesia Plan Comments:         Anesthesia Quick Evaluation

## 2019-11-17 NOTE — Progress Notes (Signed)
Pt was lying in bed when I arrived; her husband was bedside. She was very tearful, sobbing during most of our visit. She talked about how this pregnancy felt different and how she worried. She had a friend who also had a loss recently which seemed to magnify her concerns. She talked about how she felt like her baby was in a knot and she said she wondered if that was when he was dying.  She realizes there was nothing she could have done differently.  Her husband was very attentive and supportive although his grief was present and could be felt. They have a [place] background which allowed [name] to talk about Tania Ade being with God, in fact residing with him. She sobbed as she thought of what she would tell her children (they are 5 (girl) and 4 (boy)).  Mr. And Mrs. Fromer were very pleasant, even in their deep sorrow.  They were appreciative of prayer and support.  I provided emotional support and pastoral care.  Please page if additional support is needed.  I will also refer her to the day Chaplain for a follow-up visit. Chaplain Elmarie Shiley, MDiv   11/17/19 1800  Clinical Encounter Type  Visited With Patient and family together

## 2019-11-17 NOTE — H&P (Signed)
Kelly [name] is a 39 y.o. female presenting for IOL of IUFD. She is 20.0 wga today and was seen in the office with concerns of leaking fluid. She last felt baby move early this AM. She was ruled out for rupture x 3 but fetal heart tones were noted to be absent. Her cervix was closed and long. Three ultrasounds were performed and absence of FHT was confirmed by multiple providers. She has a hx of two prior C sections and is AMA. Panorama test was RR female. She was seen in the office 2 days prior to today and the [name] was reassuring except for incomplete scan. The fetus in a boy and the name is undecided. They have a boy and a girl at home who are healthy.    OB History    Gravida  2   Para  2   Term  2   Preterm      AB      Living  2     SAB      TAB      Ectopic      Multiple  0   Live Births  2          Past Medical History:  Diagnosis Date  . ADD (attention deficit disorder)   . Anxiety   . Arthritis   . Asthma    exercise induced  . GERD (gastroesophageal reflux disease)    with pregnancy  . HSV-1 (herpes simplex virus 1) infection   . Hypertension    with 1st pregnancy  . IBS (irritable bowel syndrome)    Constipation  . 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 Osteonecrosis ([place])   . Raynaud's disease    Past Surgical History:  Procedure Laterality Date  . CESAREAN SECTION N/A 07/19/2014   Procedure: CESAREAN SECTION;  Surgeon: Cyril Mourning, MD;  Location: [facility];  Service: Obstetrics;  Laterality: N/A;  . CESAREAN SECTION N/A 09/14/2015   Procedure: REPEAT CESAREAN SECTION;  Surgeon: Dian Queen, MD;  Location: [facility];  Service: Obstetrics;  Laterality: N/A;  . JOINT REPLACEMENT     bilateral knees  . KNEE ARTHROSCOPY     Multiple  . REPLACEMENT TOTAL KNEE BILATERAL    . WISDOM TOOTH EXTRACTION     Family History: family history includes Hypertension in her father, maternal grandfather, maternal grandmother, mother,  paternal grandfather, and paternal grandmother. Social History:  reports that she has never smoked. She has never used smokeless tobacco. She reports that she does not drink alcohol or use drugs.     Maternal Diabetes: No Genetic Screening: Normal Maternal Ultrasounds/Referrals: Other:  IUFD Fetal Ultrasounds or other Referrals:  None Maternal Substance Abuse:  No Significant Maternal Medications:  None Significant Maternal Lab Results:  None Other Comments:  None  Review of Systems History   unknown if currently breastfeeding. Exam Physical Exam  Prenatal labs: ABO, Rh:   Antibody:   Rubella:   RPR:    HBsAg:    HIV:    GBS:     Assessment/Plan: 39 yo G3P2002 presenting @ 20.0 wga with an IUFD.  IUFD d/w patient. US showed no evidence of abruption.  She has no signifcant RF's for an IUFD except she is AMA.  We discussed karyotype with placenta, autopsy, and labs that are indicated.  I recommended fetomaternal hemorrhage screen, CBC, parvo B 19 and RPR.  I recommend APLAS testing to be done pp.  I reviewed  that only 50% of IUFDs can be attributed to a known cause and that despite our best efforts, we may not find a cause.  Given her gestational age of 64.0 wga, I recommend induction with cytotec. She has a hx of two prior C sections but given < 24 wga, cytotec is still the preferred method of IOL. We reviewed the risks. I reiterated the risk of retained placenta and the possibility of a D&C in addition to uterine rupture. We reviewed the option of a c section and she declines. We discussed methods of pain control.  Will plan for cytotec 200-492mcg every 3-4 hours with a max dose of in 24 hours.  Baby's name is undecided. She is unsure of support from a chaplain.  Will place a perinatal loss consultation. Recurrence and antenatal testing with next pregnancy d/w pt.  All questions answered in detail. Support was given.   Ranae Pila 11/17/2019, 3:39  PM

## 2019-11-18 LAB — PARVOVIRUS B19 ANTIBODY, IGG AND IGM
Parovirus B19 IgG Abs: 6.9 index — ABNORMAL HIGH (ref 0.0–0.8)
Parovirus B19 IgM Abs: 0.1 index (ref 0.0–0.8)

## 2019-11-18 LAB — RPR: RPR Ser Ql: NONREACTIVE

## 2019-11-18 MED ORDER — BUTALBITAL-APAP-CAFFEINE 50-325-40 MG PO TABS
2.0000 | ORAL_TABLET | ORAL | Status: DC | PRN
  Administered 2019-11-18 – 2019-11-19 (×4): 2 via ORAL
  Filled 2019-11-18 (×4): qty 2

## 2019-11-18 MED ORDER — MISOPROSTOL 200 MCG PO TABS
200.0000 ug | ORAL_TABLET | ORAL | Status: DC | PRN
  Administered 2019-11-18: 200 ug via ORAL
  Filled 2019-11-18: qty 1

## 2019-11-18 MED ORDER — MISOPROSTOL 200 MCG PO TABS
400.0000 ug | ORAL_TABLET | ORAL | Status: DC
  Administered 2019-11-18: 400 ug via ORAL
  Filled 2019-11-18: qty 2

## 2019-11-18 MED ORDER — MISOPROSTOL 200 MCG PO TABS
400.0000 ug | ORAL_TABLET | ORAL | Status: DC
  Administered 2019-11-18 – 2019-11-19 (×2): 400 ug via BUCCAL
  Filled 2019-11-18 (×2): qty 2

## 2019-11-18 MED ORDER — MISOPROSTOL 200 MCG PO TABS
ORAL_TABLET | ORAL | Status: AC
  Administered 2019-11-18: 200 ug via ORAL
  Filled 2019-11-18: qty 1

## 2019-11-18 MED ORDER — MISOPROSTOL 200 MCG PO TABS
200.0000 ug | ORAL_TABLET | ORAL | Status: DC
  Administered 2019-11-18: 200 ug via VAGINAL
  Filled 2019-11-18 (×2): qty 1

## 2019-11-18 MED ORDER — MISOPROSTOL 200 MCG PO TABS: 400.0000 ug | ORAL_TABLET | ORAL | Status: DC

## 2019-11-18 MED ORDER — MISOPROSTOL 200 MCG PO TABS
200.0000 ug | ORAL_TABLET | ORAL | Status: DC
  Administered 2019-11-18: 200 ug via ORAL
  Filled 2019-11-18: qty 1

## 2019-11-18 MED ORDER — MISOPROSTOL 200 MCG PO TABS
200.0000 ug | ORAL_TABLET | Freq: Once | ORAL | Status: AC
  Administered 2019-11-18: 200 ug via ORAL

## 2019-11-18 NOTE — Progress Notes (Signed)
I offered ministry of listening to Kelly [name] as they continue to process the loss of their baby.  They raised concerns about their two children, ages 45 and 4, and I let them know about [facility] for grief counseling. Kelly [name] stated that she is probably going to need counseling as well.  I will bring her resources for finding a therapist or grief counselor.  She has a family member who recently also lost a baby, but she does not know others who have experienced the loss of a child.  She has already experienced some comments that have been hurtful and some family members who have not been as supportive as she would have hoped.  She and Kelly [name] are supporting each other well through this. We will continue to be available throughout the evening, so please page as needs arise.  [phone]  Chaplain Kelly [name], Bcc 4:12 PM

## 2019-11-18 NOTE — Progress Notes (Signed)
Dr. Rana Snare in department.  Discussed vaginal exam and uterine activity.  Order given for cytotec 600 mcg at each dosage comprised of vaginal and 400 mcg oral.  Order for RN to give 1 dose of 200 mcg orally now to meet new dosage criteria.

## 2019-11-18 NOTE — Progress Notes (Signed)
Patient ID: Kelly [name], female   DOB: Jan 19, 1981, 38 y.o.   MRN: [id number] Arrielle is doing well.  Pain controlled with epidural. Good support from husband and clergy VSSAF  Abd gravid nt  IUFD --20 weeks Previous cytotec not giving much response and per RN, some still not dissolved in vagina. Will switch to po. I have discussed the plan, delivery options and answered her questions. DL

## 2019-11-19 ENCOUNTER — Inpatient Hospital Stay (HOSPITAL_COMMUNITY): Payer: BC Managed Care – PPO

## 2019-11-19 ENCOUNTER — Encounter (HOSPITAL_COMMUNITY): Payer: Self-pay | Admitting: Obstetrics and Gynecology

## 2019-11-19 LAB — COMPREHENSIVE METABOLIC PANEL
ALT: 21 U/L (ref 0–44)
AST: 34 U/L (ref 15–41)
Albumin: 2.8 g/dL — ABNORMAL LOW (ref 3.5–5.0)
Alkaline Phosphatase: 48 U/L (ref 38–126)
Anion gap: 9 (ref 5–15)
BUN: <5 mg/dL — ABNORMAL LOW (ref 6–20)
CO2: 23 mmol/L (ref 22–32)
Calcium: 8.5 mg/dL — ABNORMAL LOW (ref 8.9–10.3)
Chloride: 108 mmol/L (ref 98–111)
Creatinine, Ser: 0.67 mg/dL (ref 0.44–1.00)
GFR calc Af Amer: >60 mL/min (ref >60)
GFR calc non Af Amer: >60 mL/min (ref >60)
Glucose, Bld: 79 mg/dL (ref 70–99)
Potassium: 3.4 mmol/L — ABNORMAL LOW (ref 3.5–5.1)
Sodium: 140 mmol/L (ref 135–145)
Total Bilirubin: 0.6 mg/dL (ref 0.3–1.2)
Total Protein: 5.7 g/dL — ABNORMAL LOW (ref 6.5–8.1)

## 2019-11-19 LAB — CBC
HCT: 34.4 % — ABNORMAL LOW (ref 36.0–46.0)
Hemoglobin: 11.6 g/dL — ABNORMAL LOW (ref 12.0–15.0)
MCH: 29 pg (ref 26.0–34.0)
MCHC: 33.7 g/dL (ref 30.0–36.0)
MCV: 86 fL (ref 80.0–100.0)
Platelets: 293 10*3/uL (ref 150–400)
RBC: 4 MIL/uL (ref 3.87–5.11)
RDW: 12.6 % (ref 11.5–15.5)
WBC: 16.7 10*3/uL — ABNORMAL HIGH (ref 4.0–10.5)
nRBC: 0 % (ref 0.0–0.2)

## 2019-11-19 MED ORDER — DIBUCAINE (PERIANAL) 1 % EX OINT: 1.0000 "application " | TOPICAL_OINTMENT | CUTANEOUS | Status: DC | PRN

## 2019-11-19 MED ORDER — ZOLPIDEM TARTRATE 5 MG PO TABS
5.0000 mg | ORAL_TABLET | Freq: Every evening | ORAL | Status: DC | PRN
  Administered 2019-11-20: 5 mg via ORAL
  Filled 2019-11-19: qty 1

## 2019-11-19 MED ORDER — ALBUTEROL SULFATE (2.5 MG/3ML) 0.083% IN NEBU: 3.0000 mL | INHALATION_SOLUTION | RESPIRATORY_TRACT | Status: DC | PRN

## 2019-11-19 MED ORDER — WITCH HAZEL-GLYCERIN EX PADS: 1.0000 "application " | MEDICATED_PAD | CUTANEOUS | Status: DC | PRN

## 2019-11-19 MED ORDER — ONDANSETRON HCL 4 MG PO TABS
4.0000 mg | ORAL_TABLET | ORAL | Status: DC | PRN
  Administered 2019-11-20: 4 mg via ORAL
  Filled 2019-11-19: qty 1

## 2019-11-19 MED ORDER — FAMOTIDINE 20 MG PO TABS
10.0000 mg | ORAL_TABLET | Freq: Every day | ORAL | Status: DC
  Administered 2019-11-20: 10 mg via ORAL
  Filled 2019-11-19: qty 1

## 2019-11-19 MED ORDER — OXYCODONE HCL 5 MG PO TABS
10.0000 mg | ORAL_TABLET | ORAL | Status: DC | PRN
  Administered 2019-11-19 – 2019-11-20 (×4): 10 mg via ORAL
  Filled 2019-11-19 (×5): qty 2

## 2019-11-19 MED ORDER — SIMETHICONE 80 MG PO CHEW: 80.0000 mg | CHEWABLE_TABLET | ORAL | Status: DC | PRN

## 2019-11-19 MED ORDER — SENNOSIDES-DOCUSATE SODIUM 8.6-50 MG PO TABS
2.0000 | ORAL_TABLET | ORAL | Status: DC
  Administered 2019-11-20: 2 via ORAL
  Filled 2019-11-19: qty 2

## 2019-11-19 MED ORDER — DIPHENHYDRAMINE HCL 25 MG PO CAPS: 25.0000 mg | ORAL_CAPSULE | Freq: Four times a day (QID) | ORAL | Status: DC | PRN

## 2019-11-19 MED ORDER — PRENATAL MULTIVITAMIN CH
1.0000 | ORAL_TABLET | Freq: Every day | ORAL | Status: DC
  Administered 2019-11-20: 1 via ORAL
  Filled 2019-11-19: qty 1

## 2019-11-19 MED ORDER — ACETAMINOPHEN 325 MG PO TABS: 650.0000 mg | ORAL_TABLET | ORAL | Status: DC | PRN

## 2019-11-19 MED ORDER — METHOCARBAMOL 500 MG PO TABS
500.0000 mg | ORAL_TABLET | Freq: Four times a day (QID) | ORAL | Status: DC | PRN
  Administered 2019-11-19 – 2019-11-20 (×2): 500 mg via ORAL
  Filled 2019-11-19 (×3): qty 1

## 2019-11-19 MED ORDER — OXYCODONE HCL 5 MG PO TABS: 5.0000 mg | ORAL_TABLET | ORAL | Status: DC | PRN

## 2019-11-19 MED ORDER — IBUPROFEN 600 MG PO TABS
600.0000 mg | ORAL_TABLET | Freq: Four times a day (QID) | ORAL | Status: DC
  Administered 2019-11-19 – 2019-11-20 (×2): 600 mg via ORAL
  Filled 2019-11-19 (×3): qty 1

## 2019-11-19 MED ORDER — HYDROMORPHONE HCL 1 MG/ML IJ SOLN
INTRAMUSCULAR | Status: AC
  Filled 2019-11-19: qty 1

## 2019-11-19 MED ORDER — BENZOCAINE-MENTHOL 20-0.5 % EX AERO: 1.0000 "application " | INHALATION_SPRAY | CUTANEOUS | Status: DC | PRN

## 2019-11-19 MED ORDER — COCONUT OIL OIL: 1.0000 "application " | TOPICAL_OIL | Status: DC | PRN

## 2019-11-19 MED ORDER — OXYTOCIN 40 UNITS IN NORMAL SALINE INFUSION - SIMPLE MED
1.0000 m[IU]/min | INTRAVENOUS | Status: DC
  Administered 2019-11-19: 2 m[IU]/min via INTRAVENOUS

## 2019-11-19 MED ORDER — HYDROMORPHONE HCL 1 MG/ML IJ SOLN
1.0000 mg | Freq: Once | INTRAMUSCULAR | Status: AC
  Administered 2019-11-19: 1 mg via INTRAVENOUS

## 2019-11-19 MED ORDER — TETANUS-DIPHTH-ACELL PERTUSSIS 5-2.5-18.5 LF-MCG/0.5 IM SUSP: 0.5000 mL | Freq: Once | INTRAMUSCULAR | Status: DC

## 2019-11-19 MED ORDER — ONDANSETRON HCL 4 MG/2ML IJ SOLN: 4.0000 mg | INTRAMUSCULAR | Status: DC | PRN

## 2019-11-19 NOTE — Progress Notes (Signed)
Patient C/O pain from especially anterior right hip to her foot. It aches but gets worse if trying to roll to right side and it is excruciating if she tries to walk. Partial relief with Robaxin and oxycodone if she is still.  Today's Vitals   11/19/19 2119 11/19/19 2142 11/19/19 2157 11/19/19 2203  BP:  (!) 152/92    Pulse:  97    Resp: 18     Temp: 99.4 F (37.4 C)     TempSrc: Oral     SpO2: 100%     Weight:      Height:      PainSc: 10-Worst pain ever  10-Worst pain ever 9    Body mass index is 29.52 kg/m.   Abdomen-FFNT  RLE-no edema, no palpable cords  A/P: D/W Dr Dell Ponto is calling orthopedics         I D/W radiologist>MRI of lumbar spine and right hip ordered.         D/W patient and husband above.

## 2019-11-19 NOTE — Progress Notes (Signed)
Comfortable with epidural VSS Afeb  Results for orders placed or performed during the hospital encounter of 11/17/19 (from the past 24 hour(s))  CBC     Status: Abnormal   Collection Time: 11/19/19  9:58 AM  Result Value Ref Range   WBC 16.7 (H) 4.0 - 10.5 K/uL   RBC 4.00 3.87 - 5.11 MIL/uL   Hemoglobin 11.6 (L) 12.0 - 15.0 g/dL   HCT 94.7 (L) 65.4 - 65.0 %   MCV 86.0 80.0 - 100.0 fL   MCH 29.0 26.0 - 34.0 pg   MCHC 33.7 30.0 - 36.0 g/dL   RDW 35.4 65.6 - 81.2 %   Platelets 293 150 - 400 K/uL   nRBC 0.0 0.0 - 0.2 %  Comprehensive metabolic panel     Status: Abnormal   Collection Time: 11/19/19  9:58 AM  Result Value Ref Range   Sodium 140 135 - 145 mmol/L   Potassium 3.4 (L) 3.5 - 5.1 mmol/L   Chloride 108 98 - 111 mmol/L   CO2 23 22 - 32 mmol/L   Glucose, Bld 79 70 - 99 mg/dL   BUN <5 (L) 6 - 20 mg/dL   Creatinine, Ser 7.51 0.44 - 1.00 mg/dL   Calcium 8.5 (L) 8.9 - 10.3 mg/dL   Total Protein 5.7 (L) 6.5 - 8.1 g/dL   Albumin 2.8 (L) 3.5 - 5.0 g/dL   AST 34 15 - 41 U/L   ALT 21 0 - 44 U/L   Alkaline Phosphatase 48 38 - 126 U/L   Total Bilirubin 0.6 0.3 - 1.2 mg/dL   GFR calc non Af Amer >60 >60 mL/min   GFR calc Af Amer >60 >60 mL/min   Anion gap 9 5 - 15    Cx about 9:00 am FT/very soft/bulging LUS  Last misoprostol about 3:00 am  D/W Dr Parke Poisson, MFM>plan to place foley in cervix and begin pitocin in usual 2x2 fashion  I D/W patient and husband plan. They understand and agree. She is now feeling more cramping  Cx 2/very soft 100fr foley placed in cervix without difficulty and bulb inflated to 35cc saline Will start pitocin

## 2019-11-19 NOTE — Progress Notes (Signed)
Delivery Note At 1:39 PM a non-viable female was delivered via VBAC, Spontaneous (Presentation: Breech     ).  APGAR: , ; weight  .   Placenta status:intact  Manual removal, Intact to inspection.  Cord: Unknown with the following complications: spiral is tight.  Cord pH:  Breech delivered followed by head with gentle traction. Fetus is grossly normal/intact.  Placenta delivers grossly intact. Uterus is clean to palpation. Good hemostasis. 2 cm segment of cord and 2 cm segment of placenta sent for karyotype. D/W patient. Anesthesia: Epidural Episiotomy: None Lacerations: None Suture Repair:  Est. Blood Loss (mL):  75  Mom to postpartum.  Baby to .  Roselle Locus II 11/19/2019, 2:11 PM

## 2019-11-19 NOTE — Progress Notes (Signed)
Dr Malen Gauze notified of severe pain in right hip radiating down the entire right leg.  Patient stated that the pain did not start until after the epidural had worn off.

## 2019-11-19 NOTE — Progress Notes (Signed)
Called to evaluate this patient who had a vaginal delivery today at approximately 1330 with labor analgesia provided with lumbar epidural which was placed on 11/17/19. She reports that the insertion of the epidural was uneventful and reported no paresthesias. She was predominantly on her right side during her labor according to her significant other. Approximately 1 1/2 hours after the epidural infusion was stopped she started developing excruciating pain in her right hip radiating down her right leg to her ankle. She complains of severe pain in her right hip with movement. I called Dr. Kathi Ludwig who feels like this presentation is more of an orthopedic issue and recommends obtaining a MRI of her L-S spine and right hip. Discussed with Dr. Henderson Cloud.

## 2019-11-19 NOTE — Progress Notes (Signed)
I offered grief support throughout the day to [place] and Kelly [name] and was present during delivery of baby Elijah.  I gave them resources for follow up support, for counseling and information for our support group.    Chaplain Dyanne Carrel, Bcc Pager, [phone] 3:31 PM

## 2019-11-19 NOTE — Progress Notes (Signed)
Foley bulb placed via Dr Gaetano Net.  53furtethral catheter from foley kit used due to inability to place 268f Bulb inflated with 35cc

## 2019-11-20 LAB — CBC
HCT: 34.9 % — ABNORMAL LOW (ref 36.0–46.0)
Hemoglobin: 11.8 g/dL — ABNORMAL LOW (ref 12.0–15.0)
MCH: 29.1 pg (ref 26.0–34.0)
MCHC: 33.8 g/dL (ref 30.0–36.0)
MCV: 86 fL (ref 80.0–100.0)
Platelets: 304 10*3/uL (ref 150–400)
RBC: 4.06 MIL/uL (ref 3.87–5.11)
RDW: 12.7 % (ref 11.5–15.5)
WBC: 13.4 10*3/uL — ABNORMAL HIGH (ref 4.0–10.5)
nRBC: 0 % (ref 0.0–0.2)

## 2019-11-20 MED ORDER — CELECOXIB 200 MG PO CAPS
200.0000 mg | ORAL_CAPSULE | Freq: Every day | ORAL | Status: DC
  Administered 2019-11-20: 200 mg via ORAL
  Filled 2019-11-20: qty 1

## 2019-11-20 MED ORDER — METHOCARBAMOL 500 MG PO TABS: 500.0000 mg | ORAL_TABLET | Freq: Four times a day (QID) | ORAL | 1 refills | Status: DC | PRN

## 2019-11-20 MED ORDER — ALPRAZOLAM 0.25 MG PO TABS: 0.2500 mg | ORAL_TABLET | Freq: Two times a day (BID) | ORAL | 0 refills | Status: AC | PRN

## 2019-11-20 MED ORDER — ALPRAZOLAM 0.25 MG PO TABS
0.2500 mg | ORAL_TABLET | Freq: Two times a day (BID) | ORAL | Status: DC | PRN
  Administered 2019-11-20: 0.25 mg via ORAL
  Filled 2019-11-20: qty 1

## 2019-11-20 MED ORDER — OXYCODONE HCL 5 MG PO TABS: 5.0000 mg | ORAL_TABLET | Freq: Four times a day (QID) | ORAL | 0 refills | Status: DC | PRN

## 2019-11-20 MED ORDER — DULOXETINE HCL 30 MG PO CPEP: 30.0000 mg | ORAL_CAPSULE | Freq: Every day | ORAL | 3 refills | Status: DC

## 2019-11-20 MED ORDER — DULOXETINE HCL 30 MG PO CPEP
30.0000 mg | ORAL_CAPSULE | Freq: Every day | ORAL | Status: DC
  Administered 2019-11-20: 30 mg via ORAL
  Filled 2019-11-20: qty 1

## 2019-11-20 MED ORDER — ONDANSETRON HCL 4 MG PO TABS: 4.0000 mg | ORAL_TABLET | Freq: Three times a day (TID) | ORAL | 0 refills | Status: DC | PRN

## 2019-11-20 MED ORDER — CELECOXIB 200 MG PO CAPS: 200.0000 mg | ORAL_CAPSULE | Freq: Every day | ORAL | 1 refills | Status: DC

## 2019-11-20 NOTE — Progress Notes (Signed)
Still having pain in right leg making ambulating very difficult. Lochia mild/moderate. Voiding well and tolerating regular diet Patient voices some anxiety and inquires about medication  Today's Vitals   11/20/19 0408 11/20/19 0500 11/20/19 0608 11/20/19 0648  BP:      Pulse:      Resp:      Temp:      TempSrc:      SpO2:      Weight:      Height:      PainSc: Asleep Asleep Asleep Asleep   Body mass index is 29.52 kg/m.   FFNT  Tender over right lateral thigh  MRI IMPRESSION: Normal MRI of the lumbar spine  MRI IMPRESSION: 1. Findings consistent with extensive iliotibial band sprain, likely due to iliotibial band syndrome with overlying subcutaneous edema. 2. Extensive muscular edema/mild strain involving the gluteal medius, gluteal minimus, vastus lateralis musculature. 3. There is also mild muscular edema involving the vastus intermedius and quadratus femoris.  D/W last night and this am with Dr Aundria Rud, ortho on call  A/P: 1) PP-doing well         2) MS pain, MRI C/W iliotibial syndrome         3)Situational depression/anxiety           D/W option of steroid dose pack. 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 Therefore will hold steroids. Will ice prn, Celebrex, hold ibuprofen, Tylenol prn. Will also start Cymbalta and xanax prn. Will see how she feels this afternoon.

## 2019-11-20 NOTE — Progress Notes (Signed)
MRI of L-S spine was normal. Pain is apparently of musculoskeletal origin. MRI of Right Hip - pending results. I doubt her discomfort is related to her epidural.

## 2019-11-20 NOTE — TOC Transition Note (Signed)
Transition of Care [facility]) - CM/SW Discharge Note   Patient Details  Name: Kelly [name] MRN: [id number] Date of Birth: Jun 18, 1981  [facility] CM/SW Contact:  Lawerance Sabal, RN Phone Number: 11/20/2019, 10:46 AM   Clinical Narrative:   Notified by nurse that patient will need a RW for DC. CM ordered it for delivery to room through [employer]. Nurse aware this may take up to three hours.           Patient Goals and CMS Choice        Discharge Placement                       Discharge Plan and Services                DME Arranged: Walker rolling DME Agency: [facility] Date DME Agency Contacted: 11/20/19 Time DME Agency Contacted: 1046 Representative spoke with at DME Agency: [place]            Social Determinants of Health (SDOH) Interventions     Readmission Risk Interventions No flowsheet data found.

## 2019-11-20 NOTE — Discharge Summary (Signed)
Postpartum Discharge Summary  Date of Service updated 11/20/19     Patient Name: Kelly [name] DOB: Dec 21, 1980 MRN: [id number]  Date of admission: 11/17/2019 Delivery date:11/19/2019  Delivering provider: Everlene Farrier  Date of discharge: 11/20/2019  Admitting diagnosis: IUFD at 25 weeks or more of gestation [O36.4XX0] Intrauterine pregnancy: [redacted]w[redacted]d    Secondary diagnosis:  Active Problems:   IUFD at 223weeks or more of gestation  Additional problems: iliotibial syndrome RLE    Discharge diagnosis: IUFD delivered, iliotibial syndrome RLE                                             Post partum procedures:MRI Augmentation: Pitocin, Cytotec and IP [facility]oley [facility] With [facility]aginal Delivery   39y.o. yo G3P2002 at 267w2das admitted to the hospital 11/17/2019 for induction of labor.  Indication for induction: IUFD @ 38 weeks.  Patient had an uncomplicated labor course as follows: Membrane Rupture Time/Date: 1:30 PM ,11/19/2019   Delivery Method:VBAC, Spontaneous  Episiotomy: None  Lacerations:  None  Details of delivery can be found in separate delivery note. Patient is discharged home 11/20/19. Patient underwent misoprostol IOL over 2 1/2 days. Epidural placed on evening of admission. She received multiple doses of buccal and vaginal misoprostol. On DOD a IC foley was placed and pitocin per standard 2 x 2 protocol. SVD of nonviable breech was accomplished without complication. Placenta delivered spontaneously and intact. About 1 1/2 hours later epidural wore off and she developed severe RLE pain from hip to foot. Robaxin failed to give adequate relief. Dr FoRoyce Macadamiaf anesthesia examined patient and was doubtful of epidural hematoma but he did recommend immediate consultation and evaluation. He contacted neurology who thought it was more likely an orthopedics issue. MRI of lumbar spine and right hip were obtained. Lumbar spine was normal. Right  hip C/W edema of iliotibial syndrome. Telephone consultation with orthopedics recommended steroid dose pack, NSAID, ice and FU with orthopedist (bilat knee replacements). 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 She voiced anxiety and some depression. Cymbalta, Celebrex, Tylenol, Robaxin, xanax prn, ice, walker. She can ambulate to BR with walker. She will FU with her orthopedist for leg pain. Newborn Data: Birth date:11/19/2019  Birth time:1:39 PM  Gender:Female  Living status:Fetal Demise  Apgars:0 ,0   Magnesium Sulfate received: No BMZ received: No Rhophylac:No MMR:No T-DaP: Flu: No Transfusion:No  Physical exam  Vitals:   11/19/19 2142 11/19/19 2300 11/20/19 0319 11/20/19 0749  BP: (!) 152/92 126/73 130/83 129/81  Pulse: 97 83 73 90  Resp:  _0 Temp:  98.4 F (36.9 C) 98.3 F (36.8 C) 98.3 F (36.8 C)  TempSrc:  Oral Oral Oral  SpO2:  100% 100% 99%  Weight:      Height:       General: alert and cooperative, mild distress with RLE pain Ambulating with walker Lochia: appropriate Uterine Fundus: firm Incision: N/A DVT Evaluation: No evidence of DVT seen on physical exam. Labs: Lab Results  Component Value Date   WBC 13.4 (H) 11/20/2019   HGB 11.8 (L) 11/20/2019   HCT 34.9 (L) 11/20/2019   MCV 86.0 11/20/2019   PLT 304 11/20/2019   CMP Latest Ref Rng & Units 11/19/2019  Glucose 70 - 99 mg/dL 79  BUN 6 - 20 mg/dL <5(L)  Creatinine 0.44 - 1.00 mg/dL 0.67  Sodium 135 - 145 mmol/L 140  Potassium 3.5 - 5.1 mmol/L 3.4(L)  Chloride 98 - 111 mmol/L 108  CO2 22 - 32 mmol/L 23  Calcium 8.9 - 10.3 mg/dL 8.5(L)  Total Protein 6.5 - 8.1 g/dL 5.7(L)  Total Bilirubin 0.3 - 1.2 mg/dL 0.6  Alkaline Phos 38 - 126 U/L 48  AST 15 - 41 U/L 34  ALT 0 - 44 U/L 21   Edinburgh Score: No flowsheet data found.    After visit meds:  Allergies as of 11/20/2019      Reactions   Latex Itching   Condom with u/s probe caused severe internal  itching   Prednisone Other (See Comments)   Reaction:  Knee & joint pain.      Medication List    STOP taking these medications   famotidine 10 MG tablet Commonly known as: PEPCID   ibuprofen 600 MG tablet Commonly known as: ADVIL   prochlorperazine 10 MG tablet Commonly known as: Compazine   topiramate 25 MG tablet Commonly known as: TOPAMAX     TAKE these medications   acetaminophen 325 MG tablet Commonly known as: TYLENOL Take 650 mg by mouth every 6 (six) hours as needed for mild pain.   ALPRAZolam 0.25 MG tablet Commonly known as: XANAX Take 1 tablet (0.25 mg total) by mouth 2 (two) times daily as needed for anxiety.   celecoxib 200 MG capsule Commonly known as: CELEBREX Take 1 capsule (200 mg total) by mouth daily. Start taking on: Nov 21, 2019   DULoxetine 30 MG capsule Commonly known as: CYMBALTA Take 1 capsule (30 mg total) by mouth daily. Start taking on: Nov 21, 2019   loratadine 10 MG tablet Commonly known as: CLARITIN Take 10 mg by mouth daily.   methocarbamol 500 MG tablet Commonly known as: ROBAXIN Take 1 tablet (500 mg total) by mouth every 6 (six) hours as needed for muscle spasms.   ondansetron 4 MG tablet Commonly known as: ZOFRAN Take 1 tablet (4 mg total) by mouth every 8 (eight) hours as needed for nausea.   oxyCODONE 5 MG immediate release tablet Commonly known as: Oxy IR/ROXICODONE Take 1 tablet (5 mg total) by mouth every 6 (six) hours as needed (pain scale 4-7).   prenatal multivitamin Tabs tablet Take 1 tablet by mouth daily.   SUMAtriptan 100 MG tablet Commonly known as: IMITREX Take 1 tablet (100 mg total) by mouth once as needed. May repeat in 2 hours if headache persists or recurs.   Ventolin HFA 108 (90 Base) MCG/ACT inhaler Generic drug: albuterol Inhale 1 puff into the lungs every 4 (four) hours as needed for wheezing or shortness of breath.            Durable Medical Equipment  (From admission, onward)          Start     Ordered   11/20/19 1028  For home use only DME Walker rolling  Once    Question Answer Comment  Walker: With 5 Inch Wheels   Patient needs a walker to treat with the following condition Iliotibial band syndrome of right side      11/20/19 1030           Discharge home in stable condition Infant Feeding: N/A Infant Russell Discharge instruction: per After Visit Summary and Postpartum booklet. Activity: Advance as tolerated. Pelvic rest for 6 weeks.  Diet: routine diet Anticipated  Birth Control: Unsure Postpartum Appointment:2 weeks Additional Postpartum F/U: FU with orthopedics Future Appointments:No future appointments. Follow up Visit:      11/20/2019 Allena Katz, MD

## 2019-11-20 NOTE — Anesthesia Postprocedure Evaluation (Signed)
Anesthesia Post Note  Patient: Kelly [name]  Procedure(s) Performed: AN AD HOC LABOR EPIDURAL     Patient location during evaluation: Mother Baby Anesthesia Type: Epidural Level of consciousness: awake and alert Pain management: pain level controlled Vital Signs Assessment: post-procedure vital signs reviewed and stable Respiratory status: spontaneous breathing, nonlabored ventilation and respiratory function stable Cardiovascular status: stable Postop Assessment: no headache, no backache and epidural receding Anesthetic complications: no    Last Vitals:  Vitals:   11/20/19 0319 11/20/19 0749  BP: 130/83 129/81  Pulse: 73 90  Resp: 18 18  Temp: 36.8 C 36.8 C  SpO2: 100% 99%    Last Pain:  Vitals:   11/20/19 0749  TempSrc: Oral  PainSc:    Pain Goal: Patients Stated Pain Goal: 2 (11/20/19 0300)                 Rica Records

## 2019-11-20 NOTE — Progress Notes (Signed)
Nurse reports patient ambulating with walker and wants to go home Had some nausea with oxycodone and requests Zofran for home  Today's Vitals   11/20/19 0608 11/20/19 0648 11/20/19 0749 11/20/19 0815  BP:   129/81   Pulse:   90   Resp:   18   Temp:   98.3 F (36.8 C)   TempSrc:   Oral   SpO2:   99%   Weight:      Height:      PainSc: Asleep Asleep  7    Body mass index is 29.52 kg/m.   A/P: IUFD @20  weeks-stable         RLE pain-MRI C/W iliotibial syndrome                         Celebrex, Robaxin, Tylenol, Zofran         Anxiety/depression-Cymbalta, Xanax prn

## 2019-11-23 LAB — SURGICAL PATHOLOGY

## 2019-11-26 DIAGNOSIS — N939 Abnormal uterine and vaginal bleeding, unspecified: Secondary | ICD-10-CM | POA: Diagnosis not present

## 2019-12-01 DIAGNOSIS — G43909 Migraine, unspecified, not intractable, without status migrainosus: Secondary | ICD-10-CM | POA: Diagnosis not present

## 2019-12-08 DIAGNOSIS — N390 Urinary tract infection, site not specified: Secondary | ICD-10-CM | POA: Diagnosis not present

## 2019-12-08 DIAGNOSIS — N939 Abnormal uterine and vaginal bleeding, unspecified: Secondary | ICD-10-CM | POA: Diagnosis not present

## 2019-12-08 DIAGNOSIS — R309 Painful micturition, unspecified: Secondary | ICD-10-CM | POA: Diagnosis not present

## 2019-12-09 ENCOUNTER — Other Ambulatory Visit: Payer: Self-pay

## 2019-12-09 ENCOUNTER — Ambulatory Visit: Payer: BC Managed Care – PPO | Admitting: *Deleted

## 2019-12-09 ENCOUNTER — Ambulatory Visit: Payer: BC Managed Care – PPO | Attending: Obstetrics and Gynecology | Admitting: Obstetrics and Gynecology

## 2019-12-09 DIAGNOSIS — O364XX0 Maternal care for intrauterine death, not applicable or unspecified: Secondary | ICD-10-CM

## 2019-12-09 DIAGNOSIS — Z8759 Personal history of other complications of pregnancy, childbirth and the puerperium: Secondary | ICD-10-CM | POA: Insufficient documentation

## 2019-12-09 DIAGNOSIS — Z3169 Encounter for other general counseling and advice on procreation: Secondary | ICD-10-CM

## 2019-12-09 DIAGNOSIS — Z96653 Presence of artificial knee joint, bilateral: Secondary | ICD-10-CM | POA: Diagnosis not present

## 2019-12-09 DIAGNOSIS — J45909 Unspecified asthma, uncomplicated: Secondary | ICD-10-CM | POA: Insufficient documentation

## 2019-12-09 DIAGNOSIS — O09299 Supervision of pregnancy with other poor reproductive or obstetric history, unspecified trimester: Secondary | ICD-10-CM

## 2019-12-09 DIAGNOSIS — Z7189 Other specified counseling: Secondary | ICD-10-CM | POA: Insufficient documentation

## 2019-12-09 NOTE — Progress Notes (Signed)
Pt in for MD consult with MFM after IUFD. BP 143/85, pulse 69.

## 2019-12-09 NOTE — Consult Note (Signed)
MFM Note  Kelly [name] is a 39 year old gravida 3 para 2-0-1-2 who was seen for preconception consultation as she recently suffered a stillbirth at 20 weeks.  The patient reports that prior to the diagnosis of the stillbirth, her pregnancy had been relatively uneventful.  She had a cell free DNA test earlier in her pregnancy which indicated a low risk for trisomy 69, 26, and 13.  A female fetus was predicted.    The patient reports that on the morning in which the fetal demise was found, she felt 2-3 gushes of fluid.  She then presented to the hospital where rupture of membranes was ruled out.  She reports feeling fetal movements on the morning when the demise was diagnosed.  At the time of admission for the demise, her Kleihauer-Betke test was negative. Her Parvovirus B19 test was negative for an acute infection.  Her RPR test was nonreactive.  Her blood glucose levels were within normal limits.  Her blood type is AB+ and antibody screen was negative.  The patient reports that she had a normal fetal anatomy scan performed in your office 2 days prior to the fetal demise.  The views of the fetal heart were unable to be fully visualized during that exam.  Due to the fetal demise, the patient underwent an induction using Cytotec, Foley bulb, and Pitocin.  The placental pathology report indicated an immature placenta, fetal thrombotic vasculopathy along with a three-vessel umbilical cord.  The [employer] test indicated a normal female. The patient's obstetrical history includes 2 prior full-term uncomplicated cesarean deliveries.  She had her first cesarean delivery due to a failed induction.  Her first pregnancy was also complicated by gestational hypertension towards the end of her pregnancy.  She denies any significant past medical history.    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  She denies any alcohol, tobacco, or illegal drug use.  Kelly [name] was advised that the cause of her stillbirth remains undetermined.  She understands that despite the extensive work-up that was performed following her delivery, the tests did not provide a definitive cause as to why the stillbirth occurred.  I told her that I understand that this is often frustrating for patients as everyone wants a definitive answer, however in most cases of stillbirth, a definitive cause cannot be identified.  She was advised that the placental pathology showing fetal thrombotic vasculopathy is also inconclusive as it is uncertain if the thrombosis occurred prior to the demise or after.  Due to this finding, I would recommend that she undergo a work-up for the antiphospholipid antibodies.  She should have her blood drawn for antiphospholipid antibodies (IgM, IgG anticardiolipin antibodies, IgM Ig G anti-beta 2 glycoprotein 1, and the lupus anticoagulant).  She understands that I believe that her risk for having the antiphospholipid antibody syndrome is low.  However, should she screen positive for one or more of these antiphospholipid antibodies, she may be treated with prophylactic Lovenox and a daily baby aspirin during her future pregnancy to try to improve her obstetric outcome.  Should she screen positive for the antiphospholipid antibodies, she should also receive prophylactic Lovenox for 6 weeks postpartum.  Although the ACOG guidelines recommend that patients who screen positive for one of the antiphospholipid antibodies have a repeat test 12 weeks later to confirm the positive test, the patient states that time is running out for  both she and her husband to have another child.  Therefore, I would start treatment due to her prior history of stillbirth and if she screens positive, after one positive antiphospholipid antibody test.  The patient was advised that our goal would be to focus on her  future pregnancy to help her achieve a successful pregnancy outcome.  As she is of advanced maternal age, she should have a cell free DNA test to screen for fetal aneuploidy at 76 to 12 weeks of her future pregnancy.  She should also have a first trimester ultrasound performed to confirm her due date.  As it is uncertain if a cervical issue contributed to the demise, she should have a transvaginal cervical length measurement scheduled at around 16 weeks.  She should then have a detailed fetal anatomy scan performed at around 19 weeks.  She should then be followed with serial growth ultrasounds every 4 to 5 weeks.  Weekly fetal testing should be started at around 32 weeks.  The patient should undergo a repeat cesarean delivery at 39 weeks.  However, should she have severe anxiety regarding the risk of another stillbirth, a repeat cesarean delivery may be scheduled at around 37 weeks.  The patient stated that she is willing to accept the small risk of short-term neonatal respiratory issues associated with an earlier delivery in order to avoid another stillbirth.  Although she did report leakage of fluid, her rupture membrane check was negative.  Kelly [name] was advised that should preterm premature rupture of membranes have contributed to her stillbirth and previable delivery, that weekly injections of 17-P (Makena) starting at 16 weeks and continued until 36 weeks may be used to try to decrease her risk of another preterm birth.  As her rupture of membrane check was negative, weekly 17-P injections is probably not indicated in her future pregnancy.  The patient was reassured that I have seen many patients with her history in the past and most patients have had successful pregnancy outcomes in their subsequent pregnancies.   Kelly [name] was advised to wait 3 to 4 months before trying to conceive again.  She should continue taking a daily prenatal vitamin and folic acid while they are waiting to conceive again in  order to decrease her risk of having a baby with a neural tube defect.  At the end of our discussion, the patient stated that all her questions had been answered to her complete satisfaction.  She was advised that we look forward to helping manage her future pregnancy with you soon.    Thank you for referring this patient for Maternal-Fetal Medicine consultation.  A total of 45 minutes was spent counseling and coordinating the care for this patient.  Greater than 50% of the time was spent in direct face-to-face contact.  Recommendations: Work-up for antiphospholipid antibodies:  IgM, IgG anticardiolipin antibody IgM, IgG anti-beta-2 glycoprotein 1  Lupus anticoagulant  Wait 3 to 4 months before trying to conceive another pregnancy  Continue daily prenatal vitamin with folic acid  Once she conceives her future pregnancy:  First trimester ultrasound to confirm her dates  Cell free DNA test  Transvaginal cervical length at 16 weeks  Detailed fetal anatomy scan at 19 weeks  Serial growth ultrasounds every 4 to 5 weeks  Weekly fetal testing starting at 32 weeks  Repeat cesarean delivery at between 37 to 39 weeks

## 2019-12-16 DIAGNOSIS — O039 Complete or unspecified spontaneous abortion without complication: Secondary | ICD-10-CM | POA: Diagnosis not present

## 2020-01-13 DIAGNOSIS — R309 Painful micturition, unspecified: Secondary | ICD-10-CM | POA: Diagnosis not present

## 2020-01-13 DIAGNOSIS — N760 Acute vaginitis: Secondary | ICD-10-CM | POA: Diagnosis not present

## 2020-01-13 DIAGNOSIS — I10 Essential (primary) hypertension: Secondary | ICD-10-CM | POA: Diagnosis not present

## 2020-02-25 DIAGNOSIS — I10 Essential (primary) hypertension: Secondary | ICD-10-CM | POA: Diagnosis not present

## 2020-04-07 DIAGNOSIS — Z8739 Personal history of other diseases of the musculoskeletal system and connective tissue: Secondary | ICD-10-CM | POA: Diagnosis not present

## 2020-04-07 DIAGNOSIS — J329 Chronic sinusitis, unspecified: Secondary | ICD-10-CM | POA: Diagnosis not present

## 2020-04-07 DIAGNOSIS — I10 Essential (primary) hypertension: Secondary | ICD-10-CM | POA: Diagnosis not present

## 2020-04-07 DIAGNOSIS — Z9109 Other allergy status, other than to drugs and biological substances: Secondary | ICD-10-CM | POA: Diagnosis not present

## 2020-04-26 DIAGNOSIS — E559 Vitamin D deficiency, unspecified: Secondary | ICD-10-CM | POA: Diagnosis not present

## 2020-04-26 DIAGNOSIS — R7309 Other abnormal glucose: Secondary | ICD-10-CM | POA: Diagnosis not present

## 2020-04-26 DIAGNOSIS — R946 Abnormal results of thyroid function studies: Secondary | ICD-10-CM | POA: Diagnosis not present

## 2020-04-26 DIAGNOSIS — I10 Essential (primary) hypertension: Secondary | ICD-10-CM | POA: Diagnosis not present

## 2020-04-26 DIAGNOSIS — Z0000 Encounter for general adult medical examination without abnormal findings: Secondary | ICD-10-CM | POA: Diagnosis not present

## 2020-05-01 ENCOUNTER — Emergency Department (HOSPITAL_COMMUNITY): Payer: BC Managed Care – PPO

## 2020-05-01 ENCOUNTER — Other Ambulatory Visit: Payer: Self-pay

## 2020-05-01 ENCOUNTER — Other Ambulatory Visit (HOSPITAL_COMMUNITY): Payer: Self-pay | Admitting: Family

## 2020-05-01 ENCOUNTER — Emergency Department (HOSPITAL_COMMUNITY)
Admission: EM | Admit: 2020-05-01 | Discharge: 2020-05-01 | Disposition: A | Discharge status: Home / Self Care | Payer: BC Managed Care – PPO | Attending: Emergency Medicine | Admitting: Emergency Medicine

## 2020-05-01 DIAGNOSIS — J1282 Pneumonia due to coronavirus disease 2019: Secondary | ICD-10-CM | POA: Diagnosis not present

## 2020-05-01 DIAGNOSIS — J45909 Unspecified asthma, uncomplicated: Secondary | ICD-10-CM | POA: Diagnosis not present

## 2020-05-01 DIAGNOSIS — I10 Essential (primary) hypertension: Secondary | ICD-10-CM | POA: Insufficient documentation

## 2020-05-01 DIAGNOSIS — Z79899 Other long term (current) drug therapy: Secondary | ICD-10-CM | POA: Diagnosis not present

## 2020-05-01 DIAGNOSIS — Z91040 Latex allergy status: Secondary | ICD-10-CM | POA: Insufficient documentation

## 2020-05-01 DIAGNOSIS — U071 COVID-19: Secondary | ICD-10-CM | POA: Insufficient documentation

## 2020-05-01 DIAGNOSIS — R0602 Shortness of breath: Secondary | ICD-10-CM | POA: Diagnosis not present

## 2020-05-01 DIAGNOSIS — Z96653 Presence of artificial knee joint, bilateral: Secondary | ICD-10-CM | POA: Diagnosis not present

## 2020-05-01 DIAGNOSIS — J189 Pneumonia, unspecified organism: Secondary | ICD-10-CM | POA: Diagnosis not present

## 2020-05-01 LAB — COMPREHENSIVE METABOLIC PANEL
ALT: 27 U/L (ref 0–44)
AST: 35 U/L (ref 15–41)
Albumin: 3.6 g/dL (ref 3.5–5.0)
Alkaline Phosphatase: 62 U/L (ref 38–126)
Anion gap: 10 (ref 5–15)
BUN: 6 mg/dL (ref 6–20)
CO2: 26 mmol/L (ref 22–32)
Calcium: 8.6 mg/dL — ABNORMAL LOW (ref 8.9–10.3)
Chloride: 107 mmol/L (ref 98–111)
Creatinine, Ser: 0.91 mg/dL (ref 0.44–1.00)
GFR, Estimated: >60 mL/min (ref >60)
Glucose, Bld: 89 mg/dL (ref 70–99)
Potassium: 3.2 mmol/L — ABNORMAL LOW (ref 3.5–5.1)
Sodium: 143 mmol/L (ref 135–145)
Total Bilirubin: 0.7 mg/dL (ref 0.3–1.2)
Total Protein: 6.7 g/dL (ref 6.5–8.1)

## 2020-05-01 LAB — CBC
HCT: 41.1 % (ref 36.0–46.0)
Hemoglobin: 13.7 g/dL (ref 12.0–15.0)
MCH: 28.6 pg (ref 26.0–34.0)
MCHC: 33.3 g/dL (ref 30.0–36.0)
MCV: 85.8 fL (ref 80.0–100.0)
Platelets: 232 10*3/uL (ref 150–400)
RBC: 4.79 MIL/uL (ref 3.87–5.11)
RDW: 12.3 % (ref 11.5–15.5)
WBC: 6.2 10*3/uL (ref 4.0–10.5)
nRBC: 0 % (ref 0.0–0.2)

## 2020-05-01 LAB — LACTIC ACID, PLASMA: Lactic Acid, Venous: 1.4 mmol/L (ref 0.5–1.9)

## 2020-05-01 MED ORDER — BENZONATATE 100 MG PO CAPS: 100.0000 mg | ORAL_CAPSULE | Freq: Three times a day (TID) | ORAL | 0 refills | Status: DC

## 2020-05-01 MED ORDER — LORAZEPAM 2 MG/ML IJ SOLN: 0.5000 mg | Freq: Once | INTRAMUSCULAR | Status: DC

## 2020-05-01 MED ORDER — HYDROCODONE-HOMATROPINE 5-1.5 MG/5ML PO SYRP
5.0000 mL | ORAL_SOLUTION | Freq: Four times a day (QID) | ORAL | 0 refills | Status: DC | PRN
Start: 2020-05-01 — End: 2022-04-09

## 2020-05-01 MED ORDER — ALBUTEROL SULFATE HFA 108 (90 BASE) MCG/ACT IN AERS
1.0000 | INHALATION_SPRAY | Freq: Once | RESPIRATORY_TRACT | Status: AC
  Administered 2020-05-01: 2 via RESPIRATORY_TRACT
  Filled 2020-05-01: qty 6.7

## 2020-05-01 MED ORDER — HYDROCODONE-HOMATROPINE 5-1.5 MG/5ML PO SYRP
5.0000 mL | ORAL_SOLUTION | Freq: Four times a day (QID) | ORAL | 0 refills | Status: DC | PRN
Start: 2020-05-01 — End: 2020-05-01

## 2020-05-01 MED ORDER — LORAZEPAM 1 MG PO TABS
0.5000 mg | ORAL_TABLET | Freq: Once | ORAL | Status: AC
  Administered 2020-05-01: 0.5 mg via ORAL
  Filled 2020-05-01 (×2): qty 1

## 2020-05-01 NOTE — ED Notes (Signed)
Pt discharge instructions reviewed with the patient. The patient verbalized understanding of instructions. Pt discharged. 

## 2020-05-01 NOTE — ED Notes (Signed)
1st Lactic Acid normal. 2nd to be discontinued. 

## 2020-05-01 NOTE — ED Provider Notes (Signed)
[facility] EMERGENCY DEPARTMENT Provider Note   CSN: [id number] Arrival date & time: 05/01/20  1234     History No chief complaint on file.   Kelly [name] is a 39 y.o. female past history of ADD, anxiety, asthma, GERD, HSV 1, hypertension with pregnancy who presents for evaluation shortness of breath.  She reports that she has been sick for about 5 days.  On day 3, she started losing her taste and smell and so she got Covid tested.  Today, she was notified that she was Covid positive.  She has not been vaccinated.  She states she has been coughing and states that it is productive of clear phlegm.  She states that she feels like she is having more trouble breathing and feels like particularly when she coughs, she cannot catch her breath.  She reports some chest tightness.  She does have a history of asthma and uses inhalers.  She has been using them and states that they are not really helping.  She does not smoke.  She has had fever and states it has been up to 103.  She has been taking Tylenol and ibuprofen.  She has had some nausea and decreased appetite but no vomiting, abdominal pain. She denies any OCP use, recent immobilization, prior history of DVT/PE, recent surgery, leg swelling, or long travel.  The history is provided by the patient.       Past Medical History:  Diagnosis Date  . ADD (attention deficit disorder)   . Anxiety   . Arthritis   . Asthma    exercise induced  . GERD (gastroesophageal reflux disease)    with pregnancy  . HSV-1 (herpes simplex virus 1) infection   . Hypertension    with 1st pregnancy  . IBS (irritable bowel syndrome)    Constipation  . 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 Osteonecrosis (HCC)   . Raynaud's disease     Patient Active Problem List   Diagnosis Date Noted  . IUFD at 20 weeks or more of gestation 11/17/2019  . S/P cesarean section 09/14/2015  . Postpartum care following cesarean  delivery 07/20/2014  . Gestational hypertension 07/18/2014  . Abdominal pain affecting pregnancy   . Gestational hypertension without significant proteinuria in third trimester   . [redacted] weeks gestation of pregnancy   . [redacted] weeks gestation of pregnancy   . Gestational hypertension w/o significant proteinuria in 3rd trimester   . Encounter for fetal anatomic survey   . Group beta Strep positive 05/18/2014  . Abdominal pain in pregnancy, antepartum   . [redacted] weeks gestation of pregnancy   . Weight loss 12/03/2011  . Osteonecrosis of left knee region (HCC) 11/28/2011  . Osteonecrosis of right knee region (HCC) 11/28/2011  . Chronic pain 11/28/2011  . ADD (attention deficit disorder with hyperactivity) 11/28/2011  . Raynaud's disease 11/28/2011  . Rheumatoid arthritis(714.0) 11/28/2011    Past Surgical History:  Procedure Laterality Date  . CESAREAN SECTION N/A 07/19/2014   Procedure: CESAREAN SECTION;  Surgeon: Jeani Hawking, MD;  Location: [facility];  Service: Obstetrics;  Laterality: N/A;  . CESAREAN SECTION N/A 09/14/2015   Procedure: REPEAT CESAREAN SECTION;  Surgeon: Marcelle Overlie, MD;  Location: [facility];  Service: Obstetrics;  Laterality: N/A;  . JOINT REPLACEMENT     bilateral knees  . KNEE ARTHROSCOPY     Multiple  . REPLACEMENT TOTAL KNEE BILATERAL    . WISDOM TOOTH EXTRACTION  OB History    Gravida  3   Para  3   Term  2   Preterm      AB      Living  2     SAB      TAB      Ectopic      Multiple  0   Live Births  2           Family History  Problem Relation Age of Onset  . Hypertension Mother   . Hypertension Father   . Hypertension Maternal Grandmother   . Hypertension Maternal Grandfather   . Hypertension Paternal Grandmother   . Hypertension Paternal Grandfather     Social History   Tobacco Use  . Smoking status: Never Smoker  . Smokeless tobacco: Never Used  Vaping Use  . Vaping Use: Never used  Substance Use Topics  . Alcohol  use: No  . Drug use: No    Home Medications Prior to Admission medications   Medication Sig Start Date End Date Taking? Authorizing Provider  labetalol (NORMODYNE) 100 MG tablet Take 200 mg by mouth in the morning and at bedtime. 04/24/20  Yes [provider]  VENTOLIN HFA 108 (90 Base) MCG/ACT inhaler Inhale 1 puff into the lungs every 4 (four) hours as needed for wheezing or shortness of breath. 09/27/19  Yes [provider]  ALPRAZolam (XANAX) 0.25 MG tablet Take 1 tablet (0.25 mg total) by mouth 2 (two) times daily as needed for anxiety. Patient not taking: Reported on 05/01/2020 11/20/19   Harold Hedge, MD  benzonatate (TESSALON) 100 MG capsule Take 1 capsule (100 mg total) by mouth every 8 (eight) hours. 05/01/20   Maxwell Caul, PA-C  celecoxib (CELEBREX) 200 MG capsule Take 1 capsule (200 mg total) by mouth daily. Patient not taking: Reported on 12/09/2019 11/21/19   Harold Hedge, MD  DULoxetine (CYMBALTA) 30 MG capsule Take 1 capsule (30 mg total) by mouth daily. Patient not taking: Reported on 12/09/2019 11/21/19   Harold Hedge, MD  HYDROcodone-homatropine [facility]) 5-1.5 MG/5ML syrup Take 5 mLs by mouth every 6 (six) hours as needed for cough. 05/01/20   Maxwell Caul, PA-C  methocarbamol (ROBAXIN) 500 MG tablet Take 1 tablet (500 mg total) by mouth every 6 (six) hours as needed for muscle spasms. Patient not taking: Reported on 12/09/2019 11/20/19   Harold Hedge, MD  ondansetron (ZOFRAN) 4 MG tablet Take 1 tablet (4 mg total) by mouth every 8 (eight) hours as needed for nausea. Patient not taking: Reported on 12/09/2019 11/20/19   Harold Hedge, MD  oxyCODONE (OXY IR/ROXICODONE) 5 MG immediate release tablet Take 1 tablet (5 mg total) by mouth every 6 (six) hours as needed (pain scale 4-7). Patient not taking: Reported on 12/09/2019 11/20/19   Harold Hedge, MD  SUMAtriptan (IMITREX) 100 MG tablet Take 1 tablet (100 mg total) by mouth once as needed. May repeat  in 2 hours if headache persists or recurs. Patient not taking: Reported on 11/18/2019 04/17/16   Anson Fret, MD    Allergies    Latex and Prednisone  Review of Systems   Review of Systems  Constitutional: Positive for fatigue and fever.  Respiratory: Positive for cough and shortness of breath.   Cardiovascular: Negative for chest pain.  Gastrointestinal: Positive for nausea. Negative for abdominal pain and vomiting.  Genitourinary: Negative for dysuria and hematuria.  Neurological: Negative for headaches.  All other systems reviewed and are negative.  Physical Exam Updated Vital Signs BP 136/87 (BP Location: Left Arm)   Pulse 80   Temp 100 F (37.8 C) (Oral)   Resp 18   LMP 04/17/2020 (Approximate)   SpO2 100%   Physical Exam Vitals and nursing note reviewed.  Constitutional:      Appearance: Normal appearance. She is well-developed.     Comments: Intermittently coughing   HENT:     Head: Normocephalic and atraumatic.  Eyes:     General: Lids are normal.     Conjunctiva/sclera: Conjunctivae normal.     Pupils: Pupils are equal, round, and reactive to light.  Cardiovascular:     Rate and Rhythm: Normal rate and regular rhythm.     Pulses: Normal pulses.     Heart sounds: Normal heart sounds. No murmur heard.  No friction rub. No gallop.   Pulmonary:     Effort: Pulmonary effort is normal.     Breath sounds: Normal breath sounds.     Comments: Intermittently coughing.  No evidence of respiratory distress. No wheezing. No rales.  Abdominal:     Palpations: Abdomen is soft. Abdomen is not rigid.     Tenderness: There is no abdominal tenderness. There is no guarding.  Musculoskeletal:        General: Normal range of motion.     Cervical back: Full passive range of motion without pain.  Skin:    General: Skin is warm and dry.     Capillary Refill: Capillary refill takes less than 2 seconds.  Neurological:     Mental Status: She is alert and oriented to  person, place, and time.  Psychiatric:        Speech: Speech normal.     ED Results / Procedures / Treatments   Labs (all labs ordered are listed, but only abnormal results are displayed) Labs Reviewed  COMPREHENSIVE METABOLIC PANEL - Abnormal; Notable for the following components:      Result Value   Potassium 3.2 (*)    Calcium 8.6 (*)    All other components within normal limits  CBC  LACTIC ACID, PLASMA    EKG None  Radiology DG Chest Portable 1 View  Result Date: 05/01/2020 CLINICAL DATA:  Shortness of breath.  Reported COVID-19 positive EXAM: PORTABLE CHEST 1 VIEW COMPARISON:  May 22, 2004 FINDINGS: Airspace opacity is noted in the right upper lobe and left base regions. More subtle opacity noted in the right base. Heart size and pulmonary vascularity are normal. No adenopathy. No bone lesions. IMPRESSION: Findings indicative of multifocal pneumonia with the greatest degree of airspace opacity in the right upper lobe and left base regions. Suspect atypical organism pneumonia. Heart size normal. No adenopathy evident. Electronically Signed   By: Bretta BangWilliam  Woodruff III M.D.   On: 05/01/2020 14:16    Procedures Procedures (including critical care time)  Medications Ordered in ED Medications  albuterol (VENTOLIN HFA) 108 (90 Base) MCG/ACT inhaler 1-2 puff (2 puffs Inhalation Given 05/01/20 1803)  LORazepam (ATIVAN) tablet 0.5 mg (0.5 mg Oral Given 05/01/20 1836)    ED Course  I have reviewed the triage vital signs and the nursing notes.  Pertinent labs & imaging results that were available during my care of the patient were reviewed by me and considered in my medical decision making (see chart for details).    MDM Rules/Calculators/A&P  39 year old female past medical 3 of asthma presents for evaluation of cough, shortness of breath.  Recent diagnosis with COVID-19.  She is unvaccinated.  She is on day 5 symptoms.  On initially arrival, she  has a low-grade fever of 100.  She has some slight tachycardia 103.  No hypoxia.  Vitals otherwise stable.  On exam, she is intermittently coughing.  No wheezing noted on exam.  Suspect this is related to ongoing COVID-19 infection.  We will plan for labs, chest x-ray.  Reviewed patient's records.  She has a history of allergy to prednisone.  She had been on prednisone for asthma and had developed osteonecrosis which required surgical intervention.  I did discuss this with patient.  She does not want to be on any steroids at this time.  Lactic normal.  CBC shows no leukocytosis or anemia.  CMP shows potassium 3.3.  Overall BUN and creatinine.  CXR shows multifocal pneumonia with greatest degree of airspace opacity in the right upper lobe and left base regions.  Patient ambulated in the ED will maintain mean O2 sats between 91-95%.  Here in the ED, her tachycardia improved and she has been between 77-92.  She has not had any hypoxia.  She is still coughing which I feel is contributing to most of her symptoms.  She does not have any wheezing that would be suspicious for asthma exacerbation.  I believe controlling the cough will help as she has continued to cough throughout her ED course here.  She has remained with good oxygenation status and I feel like the coughing fits or what is largely contributing to her shortness of breath.  I have referred her to [facility] infusion given her history of asthma.  Patient instructed on following up with him.  I discussed with patient she understands. At this time, patient exhibits no emergent life-threatening condition that require further evaluation in ED. Discussed patient with Dr. Pilar Plate who is agreeable to plan. Patient had ample opportunity for questions and discussion. All patient's questions were answered with full understanding. Strict return precautions discussed. Patient expresses understanding and agreement to plan.   Portions of this note were generated with Administrator, sports. Dictation errors may occur despite best attempts at proofreading.    Final Clinical Impression(s) / ED Diagnoses Final diagnoses:  COVID-19  Pneumonia due to COVID-19 virus    Rx / DC Orders ED Discharge Orders         Ordered    HYDROcodone-homatropine (HYCODAN) 5-1.5 MG/5ML syrup  Every 6 hours PRN        05/01/20 1846    benzonatate (TESSALON) 100 MG capsule  Every 8 hours        05/01/20 1846           Maxwell Caul, PA-C 05/01/20 2013    Sabas Sous, MD 05/02/20 [id number]

## 2020-05-01 NOTE — ED Notes (Signed)
Pt resting O2 level without O2 was 95, during ambulation pt's O2  was 91.

## 2020-05-01 NOTE — ED Triage Notes (Signed)
Pt here on day 5 of covid  With c/o sob  sats 96 % on room air , pt temp 100.0 ,. Unvaccinated

## 2020-05-01 NOTE — Discharge Instructions (Signed)
As we discussed, your work-up here showed evidence of some pneumonia caused by COVID-19.  As we discussed, this is viral in nature and does not need antibiotics.  Continue using your albuterol inhaler.  I prescribed you a refill.  Additionally, use cough medications as directed to help with coughing.  I referred you to the med infusions.  They will contact you and discuss setting you up for [facility] infusion.  Closely monitor your symptoms.  Return for any worsening difficulty breathing, vomiting, fevers or any other worsening concerning symptoms.  Hello Kelly [name],   You have been scheduled to receive Regeneron (the monoclonal antibody we discussed) on : Tuesday 05/02/20 @ 11:30 am (Please arrive 15 min early).  If you have been tested outside of a [place] Facility - you MUST bring a copy of your positive test with you the morning of your appointment. You may take a photo of this and upload to your MyChart portal or have the testing facility fax the result to [phone]    The address for the infusion clinic site is:  --GPS address is [address] - the parking is located near [employer] building where you will see  COVID19 Infusion feather banner marking the entrance to parking.   (see photos below)            --Enter into the 2nd entrance where the "wave, flag banner" is at the road. Turn into this 2nd entrance and immediately turn left to park in 1 of the 5 parking spots.   --Please stay in your car and call the desk for assistance inside [phone].   --Average time in department is roughly 2.5-3 hours for Regeneron treatment - this includes preparation of the medication, IV start and the required 1 hour monitoring after the infusion.    Should you develop worsening shortness of breath, chest pain or severe breathing problems please do not wait for this appointment and go to the Emergency room for evaluation and treatment.   The day of your visit you should:   Get plenty of rest the night before and drink plenty of water  Eat a light meal/snack before coming and take your medications as prescribed   Wear warm, comfortable clothes with a shirt that can roll-up over the elbow (will need IV start).   Wear a mask   Consider bringing some activity to help pass the time  We are starting to see some insurers send bills to patients later for the administration of the medication - we are learning more information but you may receive a bill after your appointment. It has ranged from $300-640. Please contact your insurance agent to discuss prior to your appointment if you would like further details about billing specific to your policy.    I hope this helps find you feeling better,  Royal Hawthorn, NP

## 2020-05-01 NOTE — Progress Notes (Signed)
Kelly [name] on 05/01/2020 at 6:38 PM was evaluated in the ED. Request for scheduling the MAB infusion made by EDP. Information added to her D/C instructions and sent through mychart. Spoke with patient and confirmed symptoms and discussed the infusion. Patient consented to infusion.   This patient is a 39 y.o. female that meets the FDA criteria for Emergency Use Authorization of COVID monoclonal antibody casirivimab/imdevimab or bamlanivimab/eteseviamb.  Has a (+) direct SARS-CoV-2 viral test result  Has mild or moderate COVID-19   Is NOT hospitalized due to COVID-19  Is within 10 days of symptom onset  Has at least one of the high risk factor(s) for progression to severe COVID-19 and/or hospitalization as defined in EUA.  Specific high risk criteria : Asthma Symptoms of loss of taste/smell, cough, SOB, began 10/20  I have spoken and communicated the following to the patient or parent/caregiver regarding COVID monoclonal antibody treatment:  1. FDA has authorized the emergency use for the treatment of mild to moderate COVID-19 in adults and pediatric patients with positive results of direct SARS-CoV-2 viral testing who are 48 years of age and older weighing at least 40 kg, and who are at high risk for progressing to severe COVID-19 and/or hospitalization.  2. The significant known and potential risks and benefits of COVID monoclonal antibody, and the extent to which such potential risks and benefits are unknown.  3. Information on available alternative treatments and the risks and benefits of those alternatives, including clinical trials.  4. Patients treated with COVID monoclonal antibody should continue to self-isolate and use infection control measures (e.g., wear mask, isolate, social distance, avoid sharing personal items, clean and disinfect "high touch" surfaces, and frequent handwashing) according to CDC guidelines.   5. The patient or parent/caregiver has the option to accept  or refuse COVID monoclonal antibody treatment.  After reviewing this information with the patient, the patient has agreed to receive one of the available covid 19 monoclonal antibodies and will be provided an appropriate fact sheet prior to infusion. Morton Stall, NP 05/01/2020 6:38 PM

## 2020-05-02 ENCOUNTER — Ambulatory Visit (HOSPITAL_COMMUNITY)
Admission: RE | Admit: 2020-05-02 | Discharge: 2020-05-02 | Disposition: A | Discharge status: Home / Self Care | Payer: BC Managed Care – PPO | Source: Ambulatory Visit | Attending: Pulmonary Disease | Admitting: Pulmonary Disease

## 2020-05-02 DIAGNOSIS — J984 Other disorders of lung: Secondary | ICD-10-CM | POA: Insufficient documentation

## 2020-05-02 DIAGNOSIS — U071 COVID-19: Secondary | ICD-10-CM | POA: Insufficient documentation

## 2020-05-02 MED ORDER — DIPHENHYDRAMINE HCL 50 MG/ML IJ SOLN: 50.0000 mg | Freq: Once | INTRAMUSCULAR | Status: DC | PRN

## 2020-05-02 MED ORDER — ALBUTEROL SULFATE HFA 108 (90 BASE) MCG/ACT IN AERS: 2.0000 | INHALATION_SPRAY | Freq: Once | RESPIRATORY_TRACT | Status: DC | PRN

## 2020-05-02 MED ORDER — SODIUM CHLORIDE 0.9 % IV SOLN: INTRAVENOUS | Status: DC | PRN

## 2020-05-02 MED ORDER — SODIUM CHLORIDE 0.9 % IV SOLN: Freq: Once | INTRAVENOUS | Status: AC

## 2020-05-02 MED ORDER — FAMOTIDINE IN NACL 20-0.9 MG/50ML-% IV SOLN: 20.0000 mg | Freq: Once | INTRAVENOUS | Status: DC | PRN

## 2020-05-02 MED ORDER — METHYLPREDNISOLONE SODIUM SUCC 125 MG IJ SOLR: 125.0000 mg | Freq: Once | INTRAMUSCULAR | Status: DC | PRN

## 2020-05-02 MED ORDER — EPINEPHRINE 0.3 MG/0.3ML IJ SOAJ: 0.3000 mg | Freq: Once | INTRAMUSCULAR | Status: DC | PRN

## 2020-05-02 NOTE — Discharge Instructions (Signed)

## 2020-05-02 NOTE — Progress Notes (Signed)
  Diagnosis: COVID-19  Physician: Dr Wright  Procedure: [facility] Med: bamlanivimab\etesevimab infusion - Provided patient with bamlanimivab\etesevimab fact sheet for patients, parents and caregivers prior to infusion.  Complications: No immediate complications noted.  Discharge: Discharged home   Kelly [name] 05/02/2020   

## 2020-05-04 ENCOUNTER — Other Ambulatory Visit (HOSPITAL_COMMUNITY): Payer: Self-pay

## 2020-05-16 DIAGNOSIS — Z0000 Encounter for general adult medical examination without abnormal findings: Secondary | ICD-10-CM | POA: Diagnosis not present

## 2020-05-16 DIAGNOSIS — R0602 Shortness of breath: Secondary | ICD-10-CM | POA: Diagnosis not present

## 2020-06-20 DIAGNOSIS — F331 Major depressive disorder, recurrent, moderate: Secondary | ICD-10-CM | POA: Diagnosis not present

## 2020-06-29 DIAGNOSIS — F331 Major depressive disorder, recurrent, moderate: Secondary | ICD-10-CM | POA: Diagnosis not present

## 2020-07-05 DIAGNOSIS — F331 Major depressive disorder, recurrent, moderate: Secondary | ICD-10-CM | POA: Diagnosis not present

## 2020-07-13 DIAGNOSIS — F331 Major depressive disorder, recurrent, moderate: Secondary | ICD-10-CM | POA: Diagnosis not present

## 2020-07-20 DIAGNOSIS — F331 Major depressive disorder, recurrent, moderate: Secondary | ICD-10-CM | POA: Diagnosis not present

## 2020-07-31 ENCOUNTER — Other Ambulatory Visit: Payer: BC Managed Care – PPO

## 2020-07-31 DIAGNOSIS — Z20822 Contact with and (suspected) exposure to covid-19: Secondary | ICD-10-CM | POA: Diagnosis not present

## 2020-08-01 ENCOUNTER — Other Ambulatory Visit: Payer: BC Managed Care – PPO

## 2020-08-01 LAB — SARS-COV-2, NAA 2 DAY TAT

## 2020-08-01 LAB — NOVEL CORONAVIRUS, NAA: SARS-CoV-2, NAA: NOT DETECTED

## 2020-08-03 ENCOUNTER — Other Ambulatory Visit: Payer: BC Managed Care – PPO

## 2020-08-04 ENCOUNTER — Other Ambulatory Visit: Payer: BC Managed Care – PPO

## 2020-08-04 DIAGNOSIS — Z20822 Contact with and (suspected) exposure to covid-19: Secondary | ICD-10-CM | POA: Diagnosis not present

## 2020-08-05 LAB — SARS-COV-2, NAA 2 DAY TAT

## 2020-08-05 LAB — NOVEL CORONAVIRUS, NAA: SARS-CoV-2, NAA: DETECTED — AB

## 2020-09-05 DIAGNOSIS — R413 Other amnesia: Secondary | ICD-10-CM | POA: Diagnosis not present

## 2020-09-05 DIAGNOSIS — F4320 Adjustment disorder, unspecified: Secondary | ICD-10-CM | POA: Diagnosis not present

## 2020-09-05 DIAGNOSIS — R6889 Other general symptoms and signs: Secondary | ICD-10-CM | POA: Diagnosis not present

## 2021-02-15 IMAGING — MR MR LUMBAR SPINE W/O CM
4 of 5 series · 20 of 48 positions shown · non-contrast
Comparison: None.

CLINICAL DATA: Right leg pain after epidural anesthesia

EXAM:
MRI LUMBAR SPINE WITHOUT CONTRAST
TECHNIQUE: Multiplanar, multisequence MR imaging of the lumbar spine was
performed. No intravenous contrast was administered.

[Series 4: T2 · sagittal · 4.0mm · 0.55mm/px · 6 of 15 slices shown (1 of 2)]
[im 1/15]
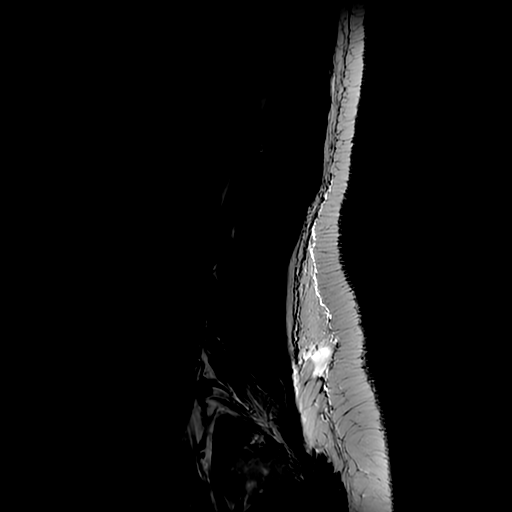
[im 3/15]
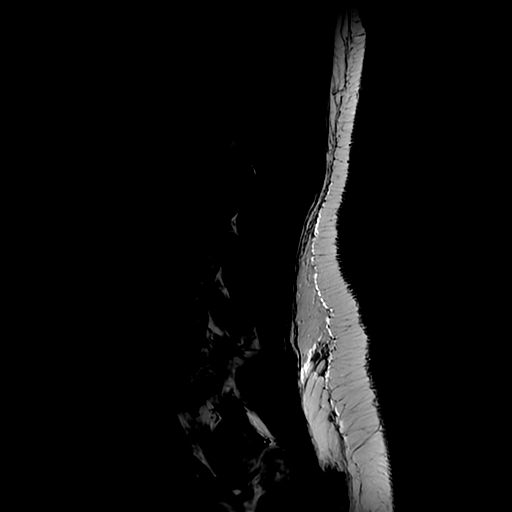
[im 6/15]
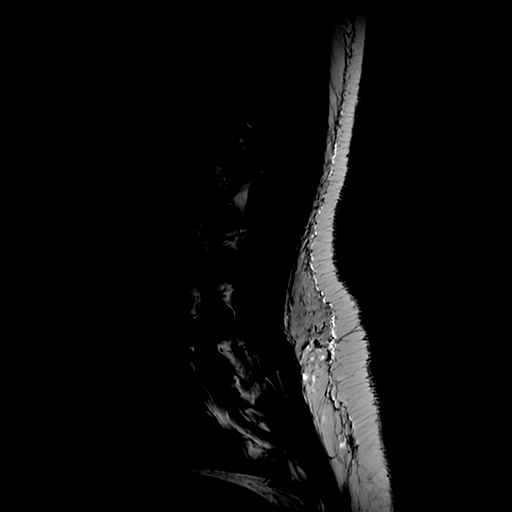
[im 9/15]
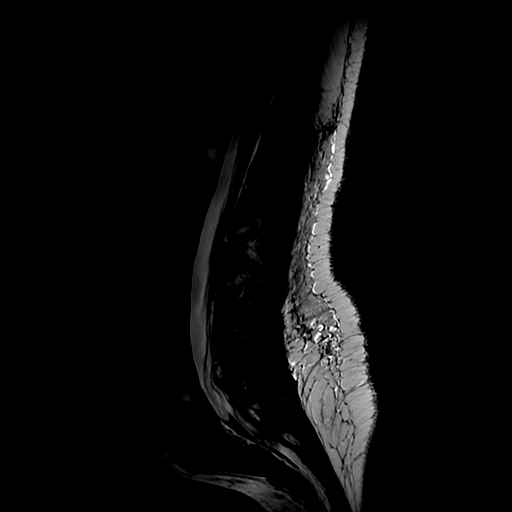
[im 12/15]
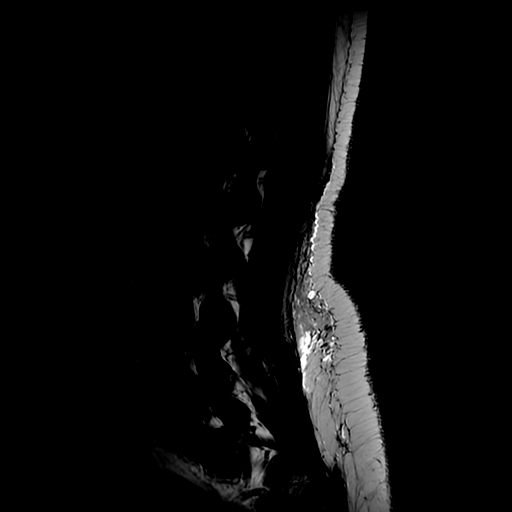
[im 15/15]
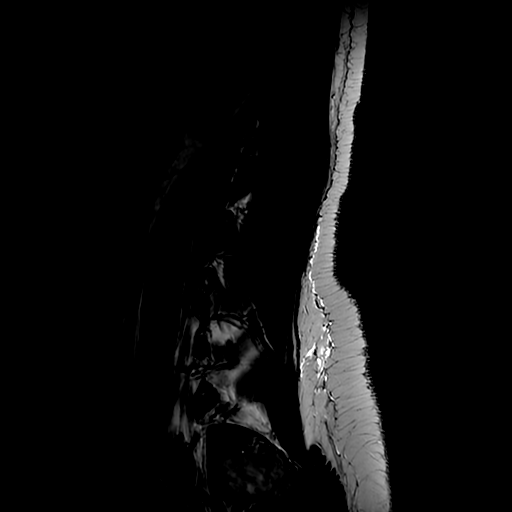

[Series 6: T1 · sagittal · 4.0mm · 0.55mm/px · 3 of 15 slices shown (1 of 2)]
[im 3/15]
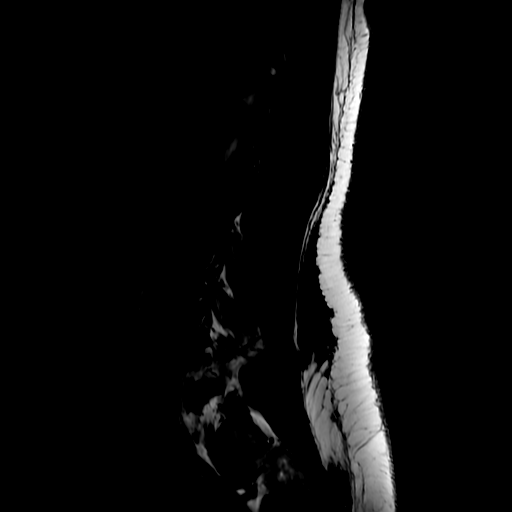
[im 9/15]
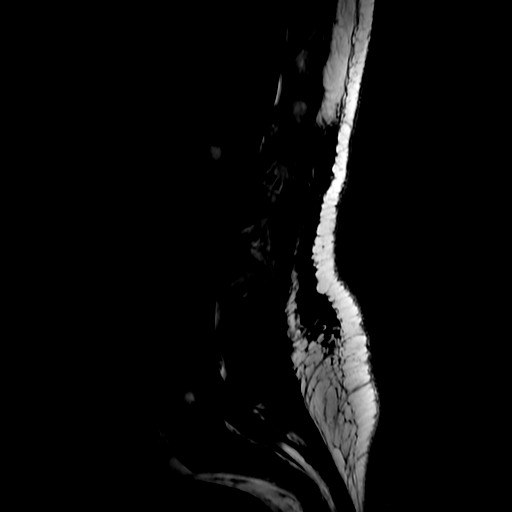
[im 15/15]
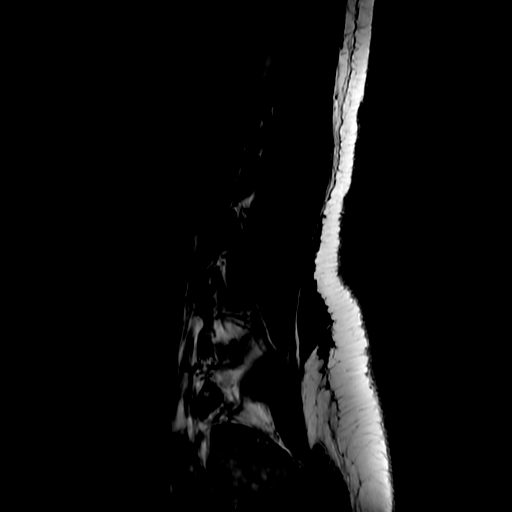

[Series 7: T2 · axial · 4.0mm · 0.39mm/px · z∈[+90,+281]mm · 8 of 39 slices shown (2 of 2)]
[im 1/39]
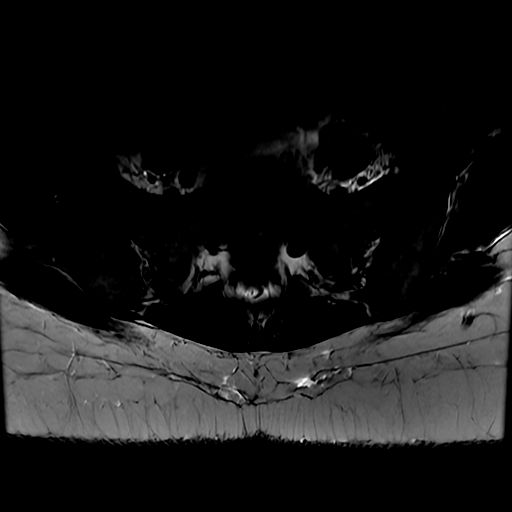
[im 6/39]
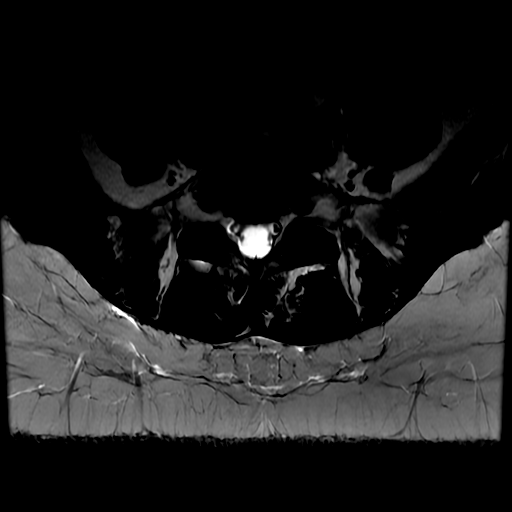
[im 11/39]
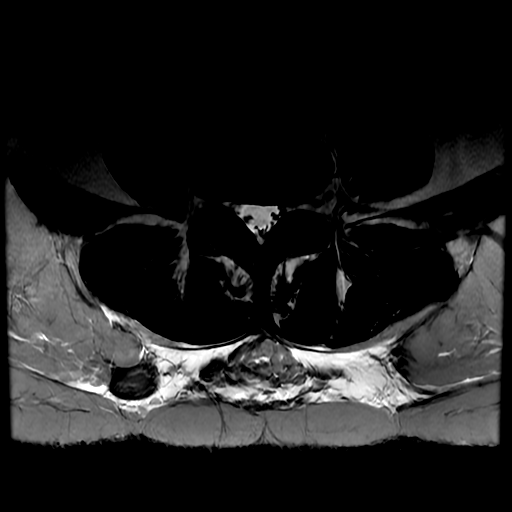
[im 17/39]
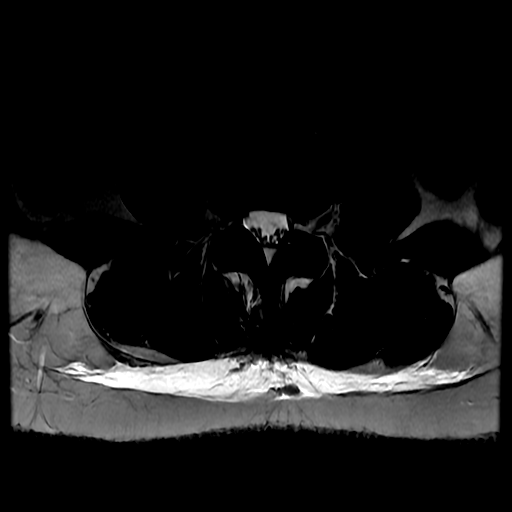
[im 20/39]
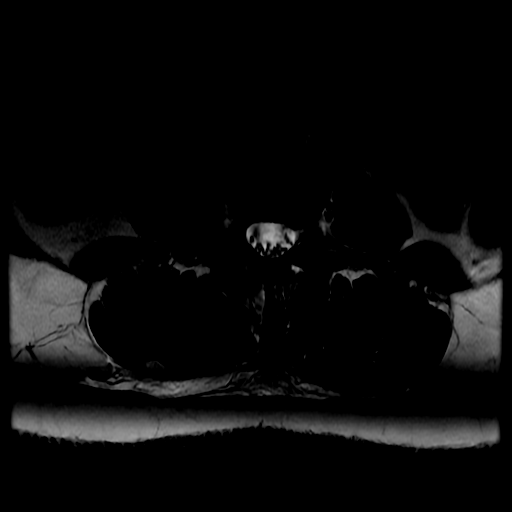
[im 22/39]
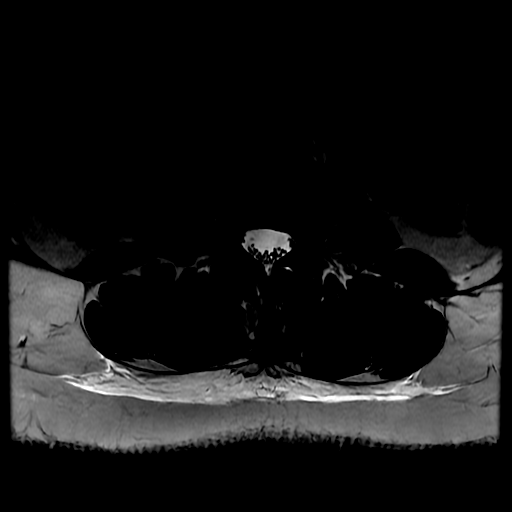
[im 28/39]
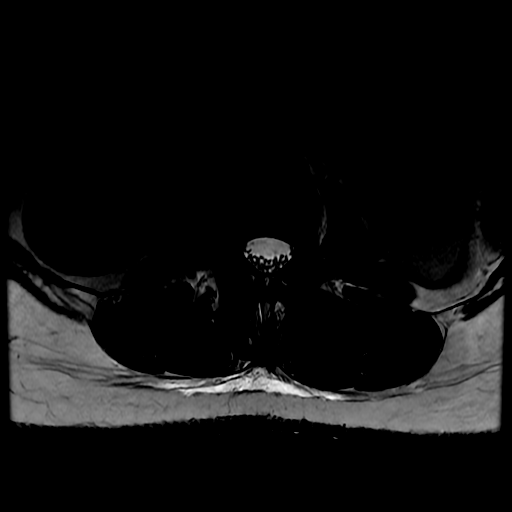
[im 33/39]
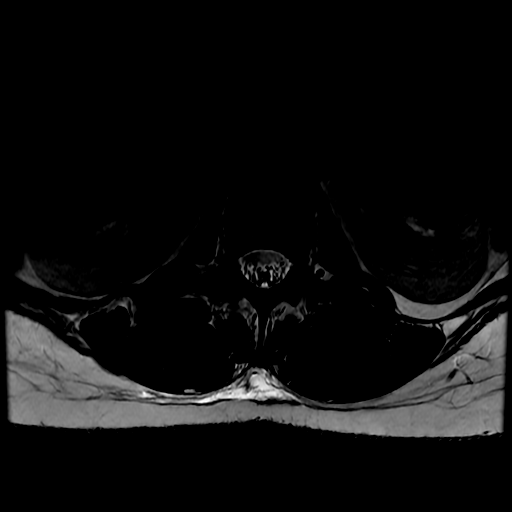

[Series 8: T1 · axial · 4.0mm · 0.39mm/px · z∈[+114,+281]mm · 3 of 39 slices shown (2 of 2)]
[im 6/39]
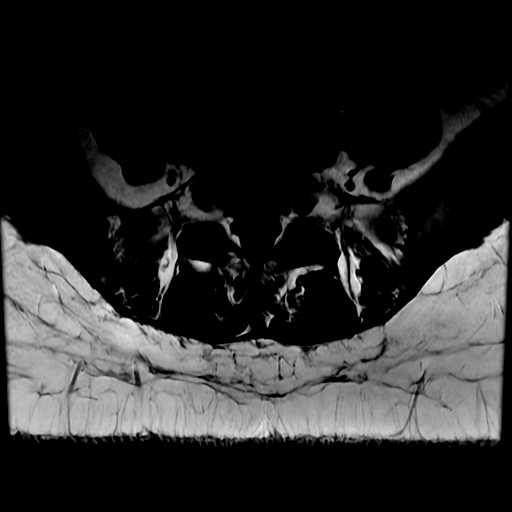
[im 20/39]
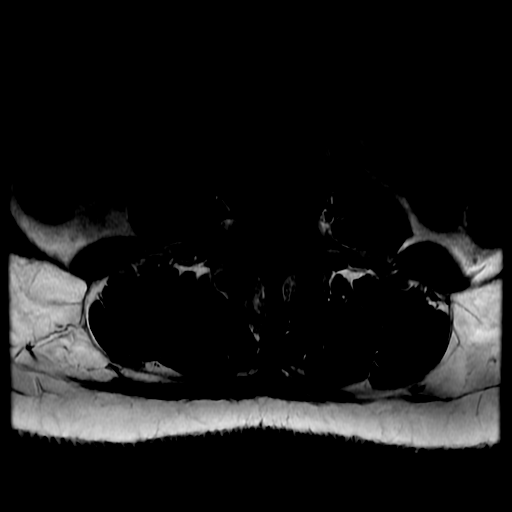
[im 33/39]
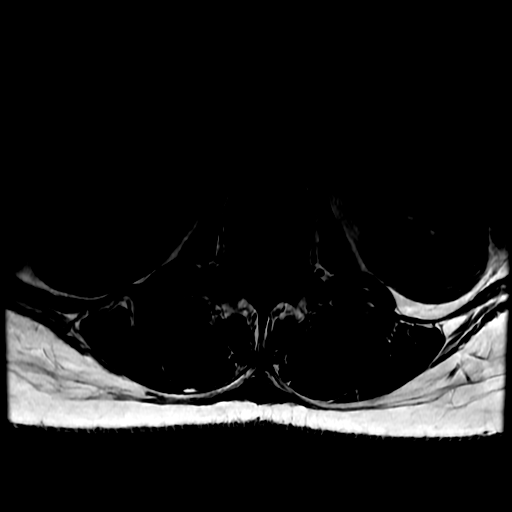

[20 of 48 positions shown; findings below may reference images not displayed]

FINDINGS: Segmentation:  Standard.

Alignment:  Physiologic.

Vertebrae:  No fracture, evidence of discitis, or bone lesion.

Conus medullaris and cauda equina: Conus extends to the L1-2 level.
Conus and cauda equina appear normal.

Paraspinal and other soft tissues: Negative.

Disc levels:

No disc herniation spinal canal stenosis or neural impingement.
IMPRESSION: Normal MRI of the lumbar spine.

## 2021-04-04 DIAGNOSIS — Z6826 Body mass index (BMI) 26.0-26.9, adult: Secondary | ICD-10-CM | POA: Diagnosis not present

## 2021-04-04 DIAGNOSIS — Z01419 Encounter for gynecological examination (general) (routine) without abnormal findings: Secondary | ICD-10-CM | POA: Diagnosis not present

## 2021-05-15 DIAGNOSIS — R7309 Other abnormal glucose: Secondary | ICD-10-CM | POA: Diagnosis not present

## 2021-05-15 DIAGNOSIS — Z79899 Other long term (current) drug therapy: Secondary | ICD-10-CM | POA: Diagnosis not present

## 2021-05-15 DIAGNOSIS — Z13220 Encounter for screening for lipoid disorders: Secondary | ICD-10-CM | POA: Diagnosis not present

## 2021-05-15 DIAGNOSIS — R946 Abnormal results of thyroid function studies: Secondary | ICD-10-CM | POA: Diagnosis not present

## 2021-05-22 DIAGNOSIS — R946 Abnormal results of thyroid function studies: Secondary | ICD-10-CM | POA: Diagnosis not present

## 2021-05-22 DIAGNOSIS — R7309 Other abnormal glucose: Secondary | ICD-10-CM | POA: Diagnosis not present

## 2021-05-22 DIAGNOSIS — Z9109 Other allergy status, other than to drugs and biological substances: Secondary | ICD-10-CM | POA: Diagnosis not present

## 2021-05-22 DIAGNOSIS — Z0000 Encounter for general adult medical examination without abnormal findings: Secondary | ICD-10-CM | POA: Diagnosis not present

## 2021-05-22 DIAGNOSIS — Z23 Encounter for immunization: Secondary | ICD-10-CM | POA: Diagnosis not present

## 2021-09-19 DIAGNOSIS — N3010 Interstitial cystitis (chronic) without hematuria: Secondary | ICD-10-CM | POA: Diagnosis not present

## 2021-09-19 DIAGNOSIS — N390 Urinary tract infection, site not specified: Secondary | ICD-10-CM | POA: Diagnosis not present

## 2021-09-25 DIAGNOSIS — D2272 Melanocytic nevi of left lower limb, including hip: Secondary | ICD-10-CM | POA: Diagnosis not present

## 2021-09-25 DIAGNOSIS — D485 Neoplasm of uncertain behavior of skin: Secondary | ICD-10-CM | POA: Diagnosis not present

## 2021-09-25 DIAGNOSIS — D2262 Melanocytic nevi of left upper limb, including shoulder: Secondary | ICD-10-CM | POA: Diagnosis not present

## 2021-09-25 DIAGNOSIS — D2271 Melanocytic nevi of right lower limb, including hip: Secondary | ICD-10-CM | POA: Diagnosis not present

## 2021-09-25 DIAGNOSIS — D224 Melanocytic nevi of scalp and neck: Secondary | ICD-10-CM | POA: Diagnosis not present

## 2021-09-25 DIAGNOSIS — D225 Melanocytic nevi of trunk: Secondary | ICD-10-CM | POA: Diagnosis not present

## 2021-10-05 DIAGNOSIS — N760 Acute vaginitis: Secondary | ICD-10-CM | POA: Diagnosis not present

## 2021-11-07 DIAGNOSIS — R3915 Urgency of urination: Secondary | ICD-10-CM | POA: Diagnosis not present

## 2021-11-07 DIAGNOSIS — R300 Dysuria: Secondary | ICD-10-CM | POA: Diagnosis not present

## 2021-11-07 DIAGNOSIS — R350 Frequency of micturition: Secondary | ICD-10-CM | POA: Diagnosis not present

## 2021-11-07 DIAGNOSIS — N390 Urinary tract infection, site not specified: Secondary | ICD-10-CM | POA: Diagnosis not present

## 2021-11-07 DIAGNOSIS — B9620 Unspecified Escherichia coli [E. coli] as the cause of diseases classified elsewhere: Secondary | ICD-10-CM | POA: Diagnosis not present

## 2021-12-18 DIAGNOSIS — D485 Neoplasm of uncertain behavior of skin: Secondary | ICD-10-CM | POA: Diagnosis not present

## 2022-03-23 ENCOUNTER — Other Ambulatory Visit: Payer: Self-pay

## 2022-03-23 ENCOUNTER — Encounter (HOSPITAL_BASED_OUTPATIENT_CLINIC_OR_DEPARTMENT_OTHER): Payer: Self-pay | Admitting: Emergency Medicine

## 2022-03-23 ENCOUNTER — Emergency Department (HOSPITAL_BASED_OUTPATIENT_CLINIC_OR_DEPARTMENT_OTHER)
Admission: EM | Admit: 2022-03-23 | Discharge: 2022-03-24 | Disposition: A | Discharge status: Home / Self Care | Payer: BC Managed Care – PPO | Attending: Emergency Medicine | Admitting: Emergency Medicine

## 2022-03-23 ENCOUNTER — Emergency Department (HOSPITAL_BASED_OUTPATIENT_CLINIC_OR_DEPARTMENT_OTHER): Payer: BC Managed Care – PPO

## 2022-03-23 DIAGNOSIS — Z91040 Latex allergy status: Secondary | ICD-10-CM | POA: Insufficient documentation

## 2022-03-23 DIAGNOSIS — R519 Headache, unspecified: Secondary | ICD-10-CM | POA: Diagnosis not present

## 2022-03-23 LAB — BASIC METABOLIC PANEL
Anion gap: 10 (ref 5–15)
BUN: 11 mg/dL (ref 6–20)
CO2: 27 mmol/L (ref 22–32)
Calcium: 9.1 mg/dL (ref 8.9–10.3)
Chloride: 104 mmol/L (ref 98–111)
Creatinine, Ser: 0.92 mg/dL (ref 0.44–1.00)
GFR, Estimated: >60 mL/min (ref >60)
Glucose, Bld: 98 mg/dL (ref 70–99)
Potassium: 3.4 mmol/L — ABNORMAL LOW (ref 3.5–5.1)
Sodium: 141 mmol/L (ref 135–145)

## 2022-03-23 LAB — CBC WITH DIFFERENTIAL/PLATELET
Abs Immature Granulocytes: 0.02 10*3/uL (ref 0.00–0.07)
Basophils Absolute: 0.1 10*3/uL (ref 0.0–0.1)
Basophils Relative: 1 %
Eosinophils Absolute: 0.2 10*3/uL (ref 0.0–0.5)
Eosinophils Relative: 2 %
HCT: 35.2 % — ABNORMAL LOW (ref 36.0–46.0)
Hemoglobin: 12 g/dL (ref 12.0–15.0)
Immature Granulocytes: 0 %
Lymphocytes Relative: 16 %
Lymphs Abs: 1.5 10*3/uL (ref 0.7–4.0)
MCH: 29.5 pg (ref 26.0–34.0)
MCHC: 34.1 g/dL (ref 30.0–36.0)
MCV: 86.5 fL (ref 80.0–100.0)
Monocytes Absolute: 0.6 10*3/uL (ref 0.1–1.0)
Monocytes Relative: 7 %
Neutro Abs: 6.6 10*3/uL (ref 1.7–7.7)
Neutrophils Relative %: 74 %
Platelets: 335 10*3/uL (ref 150–400)
RBC: 4.07 MIL/uL (ref 3.87–5.11)
RDW: 12.5 % (ref 11.5–15.5)
WBC: 8.9 10*3/uL (ref 4.0–10.5)
nRBC: 0 % (ref 0.0–0.2)

## 2022-03-23 LAB — PREGNANCY, URINE: Preg Test, Ur: NEGATIVE

## 2022-03-23 MED ORDER — PROCHLORPERAZINE EDISYLATE 10 MG/2ML IJ SOLN
10.0000 mg | Freq: Once | INTRAMUSCULAR | Status: AC
  Administered 2022-03-23: 10 mg via INTRAVENOUS
  Filled 2022-03-23: qty 2

## 2022-03-23 MED ORDER — IOHEXOL 350 MG/ML SOLN
100.0000 mL | Freq: Once | INTRAVENOUS | Status: AC | PRN
  Administered 2022-03-23: 100 mL via INTRAVENOUS

## 2022-03-23 MED ORDER — KETOROLAC TROMETHAMINE 30 MG/ML IJ SOLN
30.0000 mg | Freq: Once | INTRAMUSCULAR | Status: AC
  Administered 2022-03-23: 30 mg via INTRAVENOUS
  Filled 2022-03-23: qty 1

## 2022-03-23 MED ORDER — MAGNESIUM SULFATE 2 GM/50ML IV SOLN
2.0000 g | Freq: Once | INTRAVENOUS | Status: AC
  Administered 2022-03-23: 2 g via INTRAVENOUS
  Filled 2022-03-23: qty 50

## 2022-03-23 MED ORDER — DIPHENHYDRAMINE HCL 50 MG/ML IJ SOLN
12.5000 mg | Freq: Once | INTRAMUSCULAR | Status: AC
  Administered 2022-03-23: 12.5 mg via INTRAVENOUS
  Filled 2022-03-23: qty 1

## 2022-03-23 MED ORDER — DIPHENHYDRAMINE HCL 50 MG/ML IJ SOLN
INTRAMUSCULAR | Status: AC
  Filled 2022-03-23: qty 1

## 2022-03-23 MED ORDER — METOCLOPRAMIDE HCL 10 MG PO TABS: 10.0000 mg | ORAL_TABLET | Freq: Three times a day (TID) | ORAL | 0 refills | Status: DC | PRN

## 2022-03-23 MED ORDER — SODIUM CHLORIDE 0.9 % IV BOLUS
1000.0000 mL | Freq: Once | INTRAVENOUS | Status: AC
  Administered 2022-03-23: 1000 mL via INTRAVENOUS

## 2022-03-23 MED ORDER — METOCLOPRAMIDE HCL 5 MG/ML IJ SOLN
10.0000 mg | Freq: Once | INTRAMUSCULAR | Status: AC
  Administered 2022-03-23: 10 mg via INTRAVENOUS
  Filled 2022-03-23: qty 2

## 2022-03-23 NOTE — ED Provider Notes (Incomplete)
Kelly [name]   CSN: [id number] Arrival date & time: 03/23/22  1924     History {Add pertinent medical, surgical, social history, OB history to HPI:1} Chief Complaint  Patient presents with   Headache    Kelly [name] is a 41 y.o. female presenting to ED with a headache.  The patient reports the headache began gradually about 7 days ago.  It has been persistent.  Involving both the left temple, the right temple, the front of the head.  She has sensitivity to light.    There is a family history of brain of brain aneurysm in the patient's maternal grandfather and maternal great-grandmother.  HPI     Home Medications Prior to Admission medications   Medication Sig Start Date End Date Taking? Authorizing Provider  ALPRAZolam (XANAX) 0.25 MG tablet Take 1 tablet (0.25 mg total) by mouth 2 (two) times daily as needed for anxiety. Patient not taking: Reported on 05/01/2020 11/20/19   Everlene Farrier, MD  benzonatate (TESSALON) 100 MG capsule Take 1 capsule (100 mg total) by mouth every 8 (eight) hours. 05/01/20   Volanda Napoleon, PA-C  celecoxib (CELEBREX) 200 MG capsule Take 1 capsule (200 mg total) by mouth daily. Patient not taking: Reported on 12/09/2019 11/21/19   Everlene Farrier, MD  DULoxetine (CYMBALTA) 30 MG capsule Take 1 capsule (30 mg total) by mouth daily. Patient not taking: Reported on 12/09/2019 11/21/19   Everlene Farrier, MD  HYDROcodone-homatropine [facility]) 5-1.5 MG/5ML syrup Take 5 mLs by mouth every 6 (six) hours as needed for cough. 05/01/20   Volanda Napoleon, PA-C  labetalol (NORMODYNE) 100 MG tablet Take 200 mg by mouth in the morning and at bedtime. 04/24/20   [provider]  methocarbamol (ROBAXIN) 500 MG tablet Take 1 tablet (500 mg total) by mouth every 6 (six) hours as needed for muscle spasms. Patient not taking: Reported on 12/09/2019 11/20/19   Everlene Farrier, MD  ondansetron (ZOFRAN) 4 MG tablet Take 1 tablet (4  mg total) by mouth every 8 (eight) hours as needed for nausea. Patient not taking: Reported on 12/09/2019 11/20/19   Everlene Farrier, MD  oxyCODONE (OXY IR/ROXICODONE) 5 MG immediate release tablet Take 1 tablet (5 mg total) by mouth every 6 (six) hours as needed (pain scale 4-7). Patient not taking: Reported on 12/09/2019 11/20/19   Everlene Farrier, MD  SUMAtriptan (IMITREX) 100 MG tablet Take 1 tablet (100 mg total) by mouth once as needed. May repeat in 2 hours if headache persists or recurs. Patient not taking: Reported on 11/18/2019 04/17/16   Melvenia Beam, MD  VENTOLIN HFA 108 [phone] Base) MCG/ACT inhaler Inhale 1 puff into the lungs every 4 (four) hours as needed for wheezing or shortness of breath. 09/27/19   [provider]      Allergies    Latex and Prednisone    Review of Systems   Review of Systems  Physical Exam Updated Vital Signs BP (!) 158/96   Pulse 76   Temp 98.3 F (36.8 C) (Oral)   Resp 18   LMP 03/05/2022   SpO2 98%  Physical Exam Constitutional:      General: She is not in acute distress. HENT:     Head: Normocephalic and atraumatic.  Eyes:     Conjunctiva/sclera: Conjunctivae normal.     Pupils: Pupils are equal, round, and reactive to light.     Comments: Sensitivity to light  Cardiovascular:     Rate and  Rhythm: Normal rate and regular rhythm.  Pulmonary:     Effort: Pulmonary effort is normal. No respiratory distress.  Abdominal:     General: There is no distension.     Tenderness: There is no abdominal tenderness.  Musculoskeletal:     Cervical back: Neck supple. No rigidity.  Skin:    General: Skin is warm and dry.  Neurological:     General: No focal deficit present.     Mental Status: She is alert. Mental status is at baseline.  Psychiatric:        Mood and Affect: Mood normal.        Behavior: Behavior normal.     ED Results / Procedures / Treatments   Labs (all labs ordered are listed, but only abnormal results are  displayed) Labs Reviewed  PREGNANCY, URINE    EKG None  Radiology No results found.  Procedures Procedures  {Document cardiac monitor, telemetry assessment procedure when appropriate:1}  Medications Ordered in ED Medications - No data to display  ED Course/ Medical Decision Making/ A&P Clinical Course as of 03/24/22 0001  Sat Mar 23, 2022  2352 Patient's headache is significantly improved at this point, place referral to neurology.  Return precautions were discussed for new or worsening symptoms.  Do not see evidence of aneurysm on CTA, and relayed this to the patient and her family [MT]    Clinical Course User Index [MT] Athene Schuhmacher, Kermit Balo, MD                           Medical Decision Making Amount and/or Complexity of Data Reviewed Labs: ordered. Radiology: ordered.  Risk Prescription drug management.   This patient presents to the Emergency Department with complaint of headache.  This involves an extensive number of treatment options, and is a complaint that carries with it a high risk of complications and morbidity.  The differential diagnosis for headache includes tension type headache vs occipital headache vs migraine vs sinusitis vs intracranial bleed vs other  I ordered, reviewed, and interpreted labs, including BMP and CBC.  There were no immediate, life-threatening emergencies found in this labwork.   I ordered medication for headache and/or nausea I ordered imaging studies which included CT angiogram of the head, given patient's family history of brain aneurysm. I independently visualized and interpreted imaging which showed no acute bleed or aneurysm and the monitor tracing which showed NSR  After the interventions stated above, I reevaluated the patient and found that the patient remained clinically stable.  Her headache was significantly improved.  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  I think this is reasonable to avoid steroids.  Based on the patient's clinical exam, vital signs, risk factors, and ED testing, I felt that the patient's overall risk of life-threatening emergency such as ICH, intracranial mass or tumor was quite low.  We did discuss the small possibility of meningitis, which most likely would be viral if this is the case, given that she is now 7 days into her symptoms without fever, nuchal rigidity, leukocytosis, my suspicion for bacterial meningitis is extremely low.  I suspect this clinical presentation is most consistent with nonspecific headache, but explained to the patient that this evaluation was not a definitive diagnostic workup.  I discussed outpatient follow up with primary care provider, and provided specialist office number on the patient's discharge  paper if a referral was deemed necessary.  I discussed return precautions with the patient. I felt the patient was clinically stable for discharge.   {Document critical care time when appropriate:1} {Document review of labs and clinical decision tools ie heart score, Chads2Vasc2 etc:1}  {Document your independent review of radiology images, and any outside records:1} {Document your discussion with family members, caretakers, and with consultants:1} {Document social determinants of health affecting pt's care:1} {Document your decision making why or why not admission, treatments were needed:1} Final Clinical Impression(s) / ED Diagnoses Final diagnoses:  None    Rx / DC Orders ED Discharge Orders     None

## 2022-03-23 NOTE — ED Triage Notes (Signed)
Pt presents to ED POV. Pt c/o HA intermittent since Thursday. Pt reports stabbing pain on R side w/ R eye swelling on Thursday and HA has stayed since then. Pt reports taking OTC meds w/o relief. Pt reports htn at home (144/100).

## 2022-03-23 NOTE — Discharge Instructions (Addendum)
For pain at home you can take Reglan as needed once every 8 hours, combined with 600 mg of ibuprofen and 25 mg of Benadryl.  This is particularly effective at bedtime to help you sleep.  These medicines can be sedating and should not drive after taking them together.  A referral was placed to neurology for further management of this headache.  If your headache is worsening over the next several days, or you develop strokelike symptoms, including slurred speech, balance difficulty, loss of vision, persistent nausea or vomiting, confusion, or fever, please return to the ER.  These may be signs or symptoms of a more serious developing condition, such as a brain infection, which would need immediate attention.

## 2022-03-27 DIAGNOSIS — R519 Headache, unspecified: Secondary | ICD-10-CM | POA: Diagnosis not present

## 2022-04-09 ENCOUNTER — Telehealth: Payer: Self-pay | Admitting: *Deleted

## 2022-04-09 ENCOUNTER — Ambulatory Visit (INDEPENDENT_AMBULATORY_CARE_PROVIDER_SITE_OTHER): Payer: BC Managed Care – PPO | Admitting: Neurology

## 2022-04-09 ENCOUNTER — Other Ambulatory Visit (HOSPITAL_COMMUNITY): Payer: Self-pay

## 2022-04-09 ENCOUNTER — Encounter: Payer: Self-pay | Admitting: Neurology

## 2022-04-09 VITALS — BP 139/93 | HR 88 | Ht 64.0 in | Wt 154.0 lb

## 2022-04-09 DIAGNOSIS — G43709 Chronic migraine without aura, not intractable, without status migrainosus: Secondary | ICD-10-CM | POA: Diagnosis not present

## 2022-04-09 MED ORDER — KETOROLAC TROMETHAMINE 60 MG/2ML IM SOLN
60.0000 mg | Freq: Once | INTRAMUSCULAR | Status: AC
  Administered 2022-04-09: 60 mg via INTRAMUSCULAR

## 2022-04-09 MED ORDER — RIZATRIPTAN BENZOATE 10 MG PO TBDP: 10.0000 mg | ORAL_TABLET | ORAL | 11 refills | Status: DC | PRN

## 2022-04-09 MED ORDER — ONABOTULINUMTOXINA 200 UNITS IJ SOLR
INTRAMUSCULAR | 1 refills | Status: DC
  Filled 2022-04-09: qty 1, fill #0

## 2022-04-09 MED ORDER — ONDANSETRON 4 MG PO TBDP: 4.0000 mg | ORAL_TABLET | Freq: Three times a day (TID) | ORAL | 3 refills | Status: DC | PRN

## 2022-04-09 NOTE — Telephone Encounter (Signed)
Chronic Migraine CPT 64615  Botox [id number] Units:200  G43.709 Chronic Migraine without aura, not intractable, without status migrainous   

## 2022-04-09 NOTE — Progress Notes (Signed)
[facility]    Provider:  Dr Lucia Gaskins Requesting Provider: Terald Sleeper, MD Primary Care Provider:  Irena Reichmann, DO  CC:  migraines  HPI:  Kelly [name] is a 41 y.o. female here as requested by Terald Sleeper, MD for headache.  I reviewed ED notes from 03/23/2022 patient presented with a headache, Dr. Renaye Rakers, she reported the headache began gradually about 7 days prior, persistent, involving both the left temple right temple the front of the head with sensitivity to light.  She was concerned because of a family history of brain aneurysm in the patient's maternal grandfather and maternal great grandmother.  I have seen patient in the past it appears in 2017 I reviewed my notes from below, appears an MRI in 2017 was ordered and we gave her sumatriptan, MRI in 2017 was negative.  CT of the head and CTA of the head were both normal in the emergency room recently, I reviewed images and agree.  I do not see any additional information on "Care Everywhere" in regards to her headaches.  She has had a headache for the last 3 weeks without resolution. She has pulsating/pounding/throbbing, mostly on the left, light and sound sensitivity, nausea, no vomiting, hurts to move, Nurtec/ubrelvy was minimal. At its max it has been severe and went to the ED, had imaging, can't even move. Usually her blood pressure is on the lower side and elevated just due to pain.her migraines last 2-3 days. No medication overuse. No aura. The last 4 months she has had daily headaches, at least 10 migraine days a month. No vision changes, unknown triggers, may be worse with period, nothing is making it better. She has been going back to the gym but not doing strenuous exercise, they are regimented on their food. No other focal neurologic deficits, associated symptoms, inciting events or modifiable factors.  Reviewed notes, labs and imaging from outside physicians, which showed:  From a thorough review of  records, medications tried that can be used in migraine management include Tylenol, amitriptyline (side effects), amlodipine, Fioricet, Stadol, Celebrex capsule, Benadryl capsule, Cymbalta, ibuprofen, Toradol injections, Lamictal, magnesium IV, Robaxin, Solu-Medrol injections, Reglan tablets and injections, Zofran oral and injection, Compazine oral and injections, sumatriptan tablets, tizanidine, topiramate, Effexor, nurtec, ubrelvy, reglan, labetalol, ajovy  Labs taken March 23, 2022 include a negative pregnancy test and unremarkable CBC and BMP.  I do not see any recent thyroid testing.  EXAM: CT ANGIOGRAPHY HEAD   TECHNIQUE: Multidetector CT imaging of the head was performed using the standard protocol during bolus administration of intravenous contrast. Multiplanar CT image reconstructions and MIPs were obtained to evaluate the vascular anatomy.   RADIATION DOSE REDUCTION: This exam was performed according to the departmental dose-optimization program which includes automated exposure control, adjustment of the mA and/or kV according to patient size and/or use of iterative reconstruction technique.   CONTRAST:  OMNIPAQUE IOHEXOL 350 MG/ML SOLN   COMPARISON:  None Available.   FINDINGS: CT HEAD   Brain: There is no mass, hemorrhage or extra-axial collection. The size and configuration of the ventricles and extra-axial CSF spaces are normal. The brain parenchyma is normal, without acute or chronic infarction.   Vascular: No abnormal hyperdensity of the major intracranial arteries or dural venous sinuses. No intracranial atherosclerosis.   Skull: The visualized skull base, calvarium and extracranial soft tissues are normal.   Sinuses/Orbits: No fluid levels or advanced mucosal thickening of the visualized paranasal sinuses. No mastoid or middle ear effusion.  The orbits are normal.   CTA HEAD   POSTERIOR CIRCULATION:   --Vertebral arteries: Normal   --Inferior  cerebellar arteries: Normal.   --Basilar artery: Normal.   --Superior cerebellar arteries: Normal.   --Posterior cerebral arteries: Normal.   ANTERIOR CIRCULATION:   --Intracranial internal carotid arteries: Normal.   --Anterior cerebral arteries (ACA): Normal.   --Middle cerebral arteries (MCA): Normal.   ANATOMIC VARIANTS: None   Review of the MIP images confirms the above findings.   IMPRESSION: 1. Normal head CT. 2. Normal CTA of the head.  Interval update. MRI normal, reviewed images with patient, discussed migraine management different medications and classes we use. Will increase Topiramate, start compazine and tizanidine for the next 7-10 days to see if we can help with her current continuous migraine, initiate imitrex and have her back for migraine cocktail. She will keep in touch with me via email.    HPI:  Kelly [name] is a 41 y.o. female here as a referral from Dr. Vincente PoliGrewal for headaches. Past medical history attention deficit disorder, other chronic pain, rheumatoid arthritis. Had her baby in March. Since May she has had daily headaches. Some days it gets so back that she can;t think and is confused. The headaches are across the top of the head. She has pain does the front of her head on the top of her head. It is pressure. It can feel sharp. It can be 8/10. No significant light or sound sensitivity. She has nause when it is severe. She has vision changes especially when it is hurting badly very blurry almost double vision. Continuous all day long. Waxes and wanes but she wake sup with it and she goes to bed with it. No inciting events. Tylenol, ibuprofen and alleve don't help she does not take these anymore. Nausea. Not vomiting. She has decreased concentration. Worse in the morning and at the end of the day positional. She was treated for a month of doxycycline for lyme in April. She is not nursing. She feels confused and decreased concentration when headache is severe. No  other associated symptoms of modifying factors. There is increased stress with 2 kids. No FHx of migraines.    Review of Systems: Patient complains of symptoms per HPI as well as the following symptoms status migrainosus. Pertinent negatives and positives per HPI. All others negative.   Social History   Socioeconomic History   Marital status: Married    Spouse name: Sandy SalaamJoey Turton   Number of children: 2   Years of education: Bachelor's   Highest education level: Not on file  Occupational History   Not on file  Tobacco Use   Smoking status: Never   Smokeless tobacco: Never  Vaping Use   Vaping Use: Never used  Substance and Sexual Activity   Alcohol use: No   Drug use: No   Sexual activity: Yes  Other Topics Concern   Not on file  Social History Narrative   Lives with husband, Joey   Caffeine use: Drinks 1 cup coffee per day   No tea or soda   Social Determinants of Corporate investment bankerHealth   Financial Resource Strain: Not on file  Food Insecurity: Not on file  Transportation Needs: Not on file  Physical Activity: Not on file  Stress: Not on file  Social Connections: Not on file  Intimate Partner Violence: Not on file    Family History  Problem Relation Age of Onset   Hypertension Mother    Hypertension Father  Hypertension Maternal Grandmother    Hypertension Maternal Grandfather    Hypertension Paternal Grandmother    Hypertension Paternal Grandfather    Migraines Neg Hx     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   Osteonecrosis (HCC)    Raynaud's disease    TMJ locking    right side    Patient Active Problem List    Diagnosis Date Noted   Chronic migraine w/o aura w/o status migrainosus, not intractable 04/09/2022   IUFD at 40 weeks or more of gestation 11/17/2019   S/P cesarean section 09/14/2015   Postpartum care following cesarean delivery 07/20/2014   Gestational hypertension 07/18/2014   Abdominal pain affecting pregnancy    Gestational hypertension without significant proteinuria in third trimester    [redacted] weeks gestation of pregnancy    [redacted] weeks gestation of pregnancy    Gestational hypertension w/o significant proteinuria in 3rd trimester    Encounter for fetal anatomic survey    Group beta Strep positive 05/18/2014   Abdominal pain in pregnancy, antepartum    [redacted] weeks gestation of pregnancy    Weight loss 12/03/2011   Osteonecrosis of left knee region ([place]) 11/28/2011   Osteonecrosis of right knee region ([place]) 11/28/2011   Chronic pain 11/28/2011   ADD (attention deficit disorder with hyperactivity) 11/28/2011   Raynaud's disease 11/28/2011   Rheumatoid arthritis(714.0) 11/28/2011    Past Surgical History:  Procedure Laterality Date   CESAREAN SECTION N/A 07/19/2014   Procedure: CESAREAN SECTION;  Surgeon: Cyril Mourning, MD;  Location: [facility];  Service: Obstetrics;  Laterality: N/A;   CESAREAN SECTION N/A 09/14/2015   Procedure: REPEAT CESAREAN SECTION;  Surgeon: Dian Queen, MD;  Location: [facility];  Service: Obstetrics;  Laterality: N/A;   JOINT REPLACEMENT     bilateral knees   KNEE ARTHROSCOPY     Multiple   REPLACEMENT TOTAL KNEE BILATERAL     WISDOM TOOTH EXTRACTION      Current Outpatient Medications  Medication Sig Dispense Refill   ALPRAZolam (XANAX) 0.25 MG tablet Take 1 tablet (0.25 mg total) by mouth 2 (two) times daily as needed for anxiety. 30 tablet 0   labetalol (NORMODYNE) 100 MG tablet Take 200 mg by mouth in the morning and at bedtime.     ondansetron (ZOFRAN-ODT) 4 MG disintegrating tablet Take 1-2 tablets (4-8 mg total) by mouth every 8 (eight) hours as  needed. 30 tablet 3   rizatriptan (MAXALT-MLT) 10 MG disintegrating tablet Take 1 tablet (10 mg total) by mouth as needed for migraine. May repeat in 2 hours if needed 9 tablet 11   VENTOLIN HFA 108 (90 Base) MCG/ACT inhaler Inhale 1 puff into the lungs every 4 (four) hours as needed for wheezing or shortness of breath.     botulinum toxin Type A (BOTOX) 200 units injection Provider to inject 155 units into the muscles of the head and neck every 12 weeks. Discard remainder. 1 each 1   Current Facility-Administered Medications  Medication Dose Route Frequency Provider Last Rate Last Admin   bupivacaine (MARCAINE) 0.5 % (with  pres) injection 12 mL  12 mL Infiltration Once Anson Fret, MD        Allergies as of 04/09/2022 - Review Complete 04/09/2022  Allergen Reaction Noted   Latex Hives and Itching 07/18/2014   Prednisone Other (See Comments) 05/13/2014    Vitals: BP (!) 139/93 (BP Location: Right Arm, Patient Position: Sitting)   Pulse 88   Ht 5\' 4"  (1.626 m)   Wt 154 lb (69.9 kg)   LMP 03/28/2022   BMI 26.43 kg/m  Last Weight:  Wt Readings from Last 1 Encounters:  04/09/22 154 lb (69.9 kg)   Last Height:   Ht Readings from Last 1 Encounters:  04/09/22 5\' 4"  (1.626 m)     Physical exam: Exam: Gen: NAD, conversant, well nourised, well groomed                     CV: RRR, no MRG. No Carotid Bruits. No peripheral edema, warm, nontender Eyes: Conjunctivae clear without exudates or hemorrhage  Neuro: Detailed Neurologic Exam  Speech:    Speech is normal; fluent and spontaneous with normal comprehension.  Cognition:    The patient is oriented to person, place, and time;     recent and remote memory intact;     language fluent;     normal attention, concentration,     fund of knowledge Cranial Nerves:    The pupils are equal, round, and reactive to light. Attempted fundoscopy could not complete due to photophobia. Visual fields are full to finger confrontation.  Extraocular movements are intact. Trigeminal sensation is intact and the muscles of mastication are normal. The face is symmetric. The palate elevates in the midline. Hearing intact. Voice is normal. Shoulder shrug is normal. The tongue has normal motion without fasciculations.   Coordination:    Normal  Gait:    normal.   Motor Observation:    No asymmetry, no atrophy, and no involuntary movements noted. Tone:    Normal muscle tone.    Posture:    Posture is normal. normal erect    Strength:    Strength is V/V in the upper and lower limbs.      Sensation: intact to LT     Reflex Exam:  DTR's:    Deep tendon reflexes in the upper and lower extremities are normal bilaterally.   Toes:    The toes are downgoing bilaterally.   Clonus:    Clonus is absent.    Assessment/Plan:  patient with chronic migraines in status migrainosus tried and failed multiple medications  - Discussed Ajovy/emgality but she is not on birth control and does not want to be. Discussed risks of botox in pregnancy, (preg test neg), there are risks but botox thought to be relatively safe. She would like to pursue this option. - migriaine cocktail today in infusion suite (depacon 1g, fluids, toradol IM, oral zofran) - rizatriptan and ondansetron prn - From a thorough review of records, medications tried that can be used in migraine management include Tylenol, amitriptyline (side effects), amlodipine, Fioricet, Stadol, Celebrex capsule, Benadryl capsule, Cymbalta, ibuprofen, Toradol injections, Lamictal, magnesium IV, Robaxin, Solu-Medrol injections, Reglan tablets and injections, Zofran oral and injection, Compazine oral and injections, sumatriptan tablets, tizanidine, topiramate, Effexor, nurtec, ubrelvy, reglan, labetalol, ajovy  Meds ordered this encounter  Medications   rizatriptan (MAXALT-MLT) 10 MG disintegrating tablet    Sig: Take 1 tablet (10 mg total) by mouth as needed for migraine. May repeat in 2  hours if  needed    Dispense:  9 tablet    Refill:  11   ondansetron (ZOFRAN-ODT) 4 MG disintegrating tablet    Sig: Take 1-2 tablets (4-8 mg total) by mouth every 8 (eight) hours as needed.    Dispense:  30 tablet    Refill:  3   ketorolac (TORADOL) injection 60 mg   To prevent or relieve headaches, try the following: Cool Compress. Lie down and place a cool compress on your head.  Avoid headache triggers. If certain foods or odors seem to have triggered your migraines in the past, avoid them. A headache diary might help you identify triggers.  Include physical activity in your daily routine. Try a daily walk or other moderate aerobic exercise.  Manage stress. Find healthy ways to cope with the stressors, such as delegating tasks on your to-do list.  Practice relaxation techniques. Try deep breathing, yoga, massage and visualization.  Eat regularly. Eating regularly scheduled meals and maintaining a healthy diet might help prevent headaches. Also, drink plenty of fluids.  Follow a regular sleep schedule. Sleep deprivation might contribute to headaches Consider biofeedback. With this mind-body technique, you learn to control certain bodily functions -- such as muscle tension, heart rate and blood pressure -- to prevent headaches or reduce headache pain.    Proceed to emergency room if you experience new or worsening symptoms or symptoms do not resolve, if you have new neurologic symptoms or if headache is severe, or for any concerning symptom.    Cc: Terald Sleeper, MD,  Irena Reichmann, DO  Naomie Dean, MD  [facility] [address] [place], [place] [address]  Phone [phone] Fax [phone]  I spent 60 minutes of face-to-face and non-face-to-face time with patient on the  1. Chronic migraine w/o aura w/o status migrainosus, not intractable    diagnosis.  This included previsit chart review, lab review, study review, order entry, electronic  health record documentation, patient education on the different diagnostic and therapeutic options, counseling and coordination of care, risks and benefits of management, compliance, or risk factor reduction

## 2022-04-09 NOTE — Telephone Encounter (Signed)
-----   Message from Melvenia Beam, MD sent at 04/09/2022  4:18 PM EDT ----- Regarding: please start botox for migraine approval please start botox for migraine approval [id number]

## 2022-04-09 NOTE — Progress Notes (Signed)
Toradol 60 mg IM injection given per v.o. Dr Jaynee Eagles in [place]. Pt tolerated well. See MAR.  Romelle Starcher RN \    Per Dr Jaynee Eagles infusion giving a migraine cocktail for patient depacon 1g, toradol 60mg , fluids 286ml, oral zofran 4mg . Dr Jaynee Eagles signed orders patient walked to infusion lab Orders given to infusion RN. Naaman Plummer CMA

## 2022-04-09 NOTE — Patient Instructions (Addendum)
Acutely: Please take one tablet at the onset of your headache. If it does not improve the symptoms please take one additional tablet. Do not take more then 2 tablets in 24hrs. Do not take use more then 2 to 3 times in a week. Take it with 4mg  of ondansetron.  Start botox for prevention  OnabotulinumtoxinA Injection (Medical Use) What is this medication? ONABOTULINUMTOXINA (o na BOTT you lye num tox in eh) treats severe muscle spasms. It may also be used to prevent migraine headaches. It can treat excessive sweating when other medications do not work well enough. This medicine may be used for other purposes; ask your health care provider or pharmacist if you have questions. COMMON BRAND NAME(S): Botox What should I tell my care team before I take this medication? They need to know if you have any of these conditions: Breathing problems Cerebral palsy spasms Difficulty urinating Heart problems History of surgery where this medication is going to be used Infection at the site where this medication is going to be used Myasthenia gravis or other neurologic disease Nerve or muscle disease Surgery plans Take medications that treat or prevent blood clots Thyroid problems An unusual or allergic reaction to botulinum toxin, albumin, other medications, foods, dyes, or preservatives Pregnant or trying to get pregnant Breast-feeding How should I use this medication? This medication is for injected into a muscle. It is given by your care team in a hospital or clinic setting. A special MedGuide will be given to you before each treatment. Be sure to read this information carefully each time. Talk to your care team about the use of this medication in children. While this medication may be prescribed for children as young as 2 years for selected conditions, precautions do apply. Overdosage: If you think you have taken too much of this medicine contact a poison control center or emergency room at  once. NOTE: This medicine is only for you. Do not share this medicine with others. What if I miss a dose? This does not apply. What may interact with this medication? Aminoglycoside antibiotics, such as gentamicin, neomycin, tobramycin Muscle relaxants Other botulinum toxin injections This list may not describe all possible interactions. Give your health care provider a list of all the medicines, herbs, non-prescription drugs, or dietary supplements you use. Also tell them if you smoke, drink alcohol, or use illegal drugs. Some items may interact with your medicine. What should I watch for while using this medication? Visit your care team for regular check ups. This medication will cause weakness in the muscle where it is injected. Tell your care team if you feel unusually weak in other muscles. Get medical help right away if you have problems with breathing, swallowing, or talking. This medication might make your eyelids droop or make you see blurry or double. If you have weak muscles or trouble seeing do not drive a car, use machinery, or do other dangerous activities. This medication contains albumin from human blood. It may be possible to pass an infection in this medication, but no cases have been reported. Talk to your care team about the risks and benefits of this medication. If your activities have been limited by your condition, go back to your regular routine slowly after treatment with this medication. What side effects may I notice from receiving this medication? Side effects that you should report to your care team as soon as possible: Allergic reactions--skin rash, itching, hives, swelling of the face, lips, tongue, or throat Dryness or  irritation of the eyes, eye pain, change in vision, sensitivity to light Infection--fever, chills, cough, sore throat, wounds that don't heal, pain or trouble when passing urine, general feeling of discomfort or being unwell Spread of botulinum toxin  effects--unusual weakness or fatigue, blurry or double vision, trouble swallowing, hoarseness or trouble speaking, trouble breathing, loss of bladder control Trouble passing urine Side effects that usually do not require medical attention (report these to your care team if they continue or are bothersome): Dry mouth Eyelid drooping Fatigue Headache Pain, redness, or irritation at injection site This list may not describe all possible side effects. Call your doctor for medical advice about side effects. You may report side effects to FDA at 1-800-FDA-1088. Where should I keep my medication? This medication is given in a hospital or clinic and will not be stored at home. NOTE: This sheet is a summary. It may not cover all possible information. If you have questions about this medicine, talk to your doctor, pharmacist, or health care provider.  2023 Elsevier/Gold Standard (2021-05-07 00:00:00)

## 2022-04-10 ENCOUNTER — Encounter: Payer: Self-pay | Admitting: Neurology

## 2022-04-10 ENCOUNTER — Telehealth (HOSPITAL_COMMUNITY): Payer: Self-pay

## 2022-04-10 ENCOUNTER — Other Ambulatory Visit (HOSPITAL_COMMUNITY): Payer: Self-pay

## 2022-04-10 ENCOUNTER — Ambulatory Visit (INDEPENDENT_AMBULATORY_CARE_PROVIDER_SITE_OTHER): Payer: BC Managed Care – PPO | Admitting: Neurology

## 2022-04-10 ENCOUNTER — Telehealth: Payer: Self-pay | Admitting: Neurology

## 2022-04-10 DIAGNOSIS — G43709 Chronic migraine without aura, not intractable, without status migrainosus: Secondary | ICD-10-CM | POA: Diagnosis not present

## 2022-04-10 MED ORDER — ONABOTULINUMTOXINA 200 UNITS IJ SOLR: INTRAMUSCULAR | 1 refills | Status: DC

## 2022-04-10 MED ORDER — BUPIVACAINE HCL 0.5 % IJ SOLN: 12.0000 mL | Freq: Once | INTRAMUSCULAR | Status: AC

## 2022-04-10 NOTE — Telephone Encounter (Signed)
Spoke with Dr Jaynee Eagles who offered a nerve block. I called the patient. She states headaches might be a little bit better but is still there. She would like to try a nerve block. She has been scheduled for a 1:30 PM appt today with Dr Jaynee Eagles.

## 2022-04-10 NOTE — Telephone Encounter (Signed)
[employer] verification submitted   Key # [id number]

## 2022-04-10 NOTE — Telephone Encounter (Signed)
Ok I sent the Botox 200 unit prescription to [facility] instead of [facility].

## 2022-04-10 NOTE — Telephone Encounter (Signed)
Pt is calling. Stated she wants to let Dr. Jaynee Eagles know she is stills have a headache and not feeling well. Pt is requesting a call-back from nurse.

## 2022-04-10 NOTE — Telephone Encounter (Signed)
[employer] verification submitted   Key # [id number] 

## 2022-04-10 NOTE — Telephone Encounter (Signed)
Kelly [name] from [facility] called stating that the pt is needing her Botox filled at [facility] and not with [facility]. Please call with any questions. Kelly [name] [phone]

## 2022-04-10 NOTE — Addendum Note (Signed)
Addended by: Gildardo Griffes on: 04/10/2022 10:35 AM   Modules accepted: Orders

## 2022-04-10 NOTE — Progress Notes (Signed)
Nerve block w/o steroid: Pt signed consent  0.5% Bupivocaine  12 mL LOT: [id number] EXP: 07/2024 NDC: 63323-467-01  

## 2022-04-14 ENCOUNTER — Encounter: Payer: Self-pay | Admitting: Neurology

## 2022-04-14 NOTE — Progress Notes (Signed)
I spent over 40 minutes of face-to-face and non-face-to-face time with patient on the  1. Chronic migraine w/o aura w/o status migrainosus, not intractable    diagnosis.  This included previsit chart review, lab review, study review, order entry, electronic health record documentation, patient education on the different diagnostic and therapeutic options, counseling and coordination of care, risks and benefits of management, compliance, or risk factor reduction      All procedures a documented blood were medically necessary, reasonable and appropriate based on the patient's history, medical diagnosis and physician opinion. Verbal informed consent was obtained from the patient, patient was informed of potential risk of procedure, including bruising, bleeding, hematoma formation, infection, muscle weakness, muscle pain, numbness, transient hypertension, transient hyperglycemia and transient insomnia among others. All areas injected were topically clean with isopropyl rubbing alcohol. Nonsterile nonlatex gloves were worn during the procedure.  1. Greater occipital nerve block (by time). The greater occipital nerve site was identified at the nuchal line medial to the occipital artery. Medication was injected into the left and right occipital nerve areas and suboccipital areas. Patient's condition is associated with inflammation of the greater occipital nerve and associated multiple groups. Injection was deemed medically necessary, reasonable and appropriate. Injection represents a separate and unique surgical service.  2. Lesser occipital nerve block (by time). The lesser occipital nerve site was identified approximately 2 cm lateral to the greater occipital nerve. Occasion was injected into the left and right occipital nerve areas. Patient's condition is associated with inflammation of the lesser occipital nerve and associated muscle groups. Injection was deemed medically necessary, reasonable and  appropriate. Injection represents a separate and unique surgical service.   3. Auriculotemporal nerve block (by time): The Auriculotemporal nerve site was identified along the posterior margin of the sternocleidomastoid muscle toward the base of the ear. Medication was injected into the left and right radicular temporal nerve areas. Patient's condition is associated with inflammation of the Auriculotemporal Nerve and associated muscle groups. Injection was deemed medically necessary, reasonable and appropriate. Injection represents a separate and unique surgical service.  4. Supraorbital nerve block (by time): Supraorbital nerve site was identified along the incision of the frontal bone on the orbital/supraorbital ridge. Medication was injected into the left and right supraorbital nerve areas. Patient's condition is associated with inflammation of the supraorbital and associated muscle groups. Injection was deemed medically necessary, reasonable and appropriate. Injection represents a separate and unique surgical service. Marland Kitchen

## 2022-04-22 NOTE — Telephone Encounter (Signed)
Received a Cover My Meds Key for Botox 200 units. KEY: [id number].

## 2022-04-24 NOTE — Telephone Encounter (Signed)
   BotoxOne benefits verification scanned to chart 

## 2022-04-29 ENCOUNTER — Telehealth: Payer: Self-pay

## 2022-04-29 NOTE — Telephone Encounter (Signed)
error 

## 2022-04-29 NOTE — Telephone Encounter (Signed)
PA for botox received via fax.  CMM Key [id number]

## 2022-04-30 ENCOUNTER — Other Ambulatory Visit (HOSPITAL_COMMUNITY): Payer: Self-pay

## 2022-04-30 NOTE — Telephone Encounter (Signed)
Contacted [facility] to schedule delivery, spoke to [place]. Approval hasn't been processed yet, once processed they will contact patient for approval then [name] to schedule delivery.

## 2022-04-30 NOTE — Telephone Encounter (Signed)
LVM informing pt of botox appointment scheduled for 05/21/22 at 3:45pm with Judson Roch and informed pt to call back to reschedule if not available for that time

## 2022-04-30 NOTE — Telephone Encounter (Signed)
Can you go ahead and schedule this pt for botox about 2 weeks ago?  SP -[facility] PA [id number]

## 2022-04-30 NOTE — Telephone Encounter (Signed)
Patient Advocate Encounter  Prior Authorization for Botox 200UNIT solution has been approved.    PA# [id number] Effective dates: 04/30/2022 through 07/31/2022  Must be filled at [place], [place] Patient Advocate Specialist [place] Patient Advocate Team Direct Number: [phone]  Fax: [phone]

## 2022-04-30 NOTE — Telephone Encounter (Signed)
Patient Advocate Encounter   Received notification that prior authorization for Botox 200UNIT solution is required.   PA submitted on 04/30/2022 Key [id number] Status is pending       Lyndel Safe, [place] Patient Advocate Specialist [place] Patient Advocate Team Direct Number: [phone]  Fax: [phone]

## 2022-05-06 NOTE — Telephone Encounter (Signed)
Contacted [facility] back, spoke to [place], she stated they do not have consent to ship yet. Attmepted to contact pt while we were on phone but got VM. She did leave VM informing pt they needed consent.

## 2022-05-14 NOTE — Telephone Encounter (Signed)
Contacted [facility] back, spoke to [place] She stated they are yet to be able to contact pt to get consent to ship. She again, attempted to contact pt while we were on phone and got consent to ship. Address & hours of operation was verified. Delivery has been scheduled for 05/16/22, pt has appt scheduled 05/23/22.

## 2022-05-21 ENCOUNTER — Ambulatory Visit: Payer: BC Managed Care – PPO | Admitting: Neurology

## 2022-05-21 DIAGNOSIS — R946 Abnormal results of thyroid function studies: Secondary | ICD-10-CM | POA: Diagnosis not present

## 2022-05-21 DIAGNOSIS — Z13220 Encounter for screening for lipoid disorders: Secondary | ICD-10-CM | POA: Diagnosis not present

## 2022-05-21 DIAGNOSIS — R7309 Other abnormal glucose: Secondary | ICD-10-CM | POA: Diagnosis not present

## 2022-05-21 DIAGNOSIS — I10 Essential (primary) hypertension: Secondary | ICD-10-CM | POA: Diagnosis not present

## 2022-05-21 DIAGNOSIS — Z79899 Other long term (current) drug therapy: Secondary | ICD-10-CM | POA: Diagnosis not present

## 2022-05-23 ENCOUNTER — Encounter: Payer: Self-pay | Admitting: Neurology

## 2022-05-23 ENCOUNTER — Ambulatory Visit (INDEPENDENT_AMBULATORY_CARE_PROVIDER_SITE_OTHER): Payer: BC Managed Care – PPO | Admitting: Neurology

## 2022-05-23 VITALS — BP 156/98 | HR 101 | Ht 64.0 in | Wt 154.0 lb

## 2022-05-23 DIAGNOSIS — G43709 Chronic migraine without aura, not intractable, without status migrainosus: Secondary | ICD-10-CM | POA: Diagnosis not present

## 2022-05-23 MED ORDER — NURTEC 75 MG PO TBDP: 75.0000 mg | ORAL_TABLET | ORAL | 11 refills | Status: DC | PRN

## 2022-05-23 MED ORDER — TIZANIDINE HCL 2 MG PO TABS: 2.0000 mg | ORAL_TABLET | Freq: Four times a day (QID) | ORAL | 1 refills | Status: DC | PRN

## 2022-05-23 MED ORDER — ONABOTULINUMTOXINA 200 UNITS IJ SOLR
200.0000 [IU] | Freq: Once | INTRAMUSCULAR | Status: AC
  Administered 2022-05-23: 200 [IU] via INTRAMUSCULAR

## 2022-05-23 NOTE — Progress Notes (Signed)
Botox 200 units x 1 vial Ndc-0023-3921-02 [id number] Exp-10/2024 S/P 

## 2022-05-23 NOTE — Progress Notes (Signed)
   BOTOX PROCEDURE NOTE FOR MIGRAINE HEADACHE  HISTORY: Kelly [name] is here today for her 1st Botox injection for chronic migraine headaches. She had a nerve block on 04/10/22 with Dr. Lucia Gaskins, did not feel the nerve block was helpful, she had nausea and vomiting afterwards.  Currently having daily headache, about 10 migraines a month.  She currently has a migraine today.  She has been taking Nurtec with good benefit using samples.  Maxalt is not especially helpful.  Description of procedure:  The patient was placed in a sitting position. The standard protocol was used for Botox as follows, with 5 units of Botox injected at each site:  -Procerus muscle, midline injection  -Corrugator muscle, bilateral injection  -Frontalis muscle, bilateral injection, with 2 sites each side, medial injection was performed in the upper one third of the frontalis muscle, in the region vertical from the medial inferior edge of the superior orbital rim. The lateral injection was again in the upper one third of the forehead vertically above the lateral limbus of the cornea, 1.5 cm lateral to the medial injection site.  -Temporalis muscle injection, 4 sites, bilaterally. The first injection was 3 cm above the tragus of the ear, second injection site was 1.5 cm to 3 cm up from the first injection site in line with the tragus of the ear. The third injection site was 1.5-3 cm forward between the first 2 injection sites. The fourth injection site was 1.5 cm posterior to the second injection site.  -Occipitalis muscle injection, 3 sites, bilaterally. The first injection was done one half way between the occipital protuberance and the tip of the mastoid process behind the ear. The second injection site was done lateral and superior to the first, 1 fingerbreadth from the first injection. The third injection site was 1 fingerbreadth superiorly and medially from the first injection site.  -Cervical paraspinal muscle injection, 2 sites,  bilateral, the first injection site was 1 cm from the midline of the cervical spine, 3 cm inferior to the lower border of the occipital protuberance. The second injection site was 1.5 cm superiorly and laterally to the first injection site.  -Trapezius muscle injection was performed at 3 sites, bilaterally. The first injection site was in the upper trapezius muscle halfway between the inflection point of the neck, and the acromion. The second injection site was one half way between the acromion and the first injection site. The third injection was done between the first injection site and the inflection point of the neck.  A 200 unit bottle of Botox was used, 155 units were injected, the rest of the Botox was wasted. The patient tolerated the procedure well, there were no complications of the above procedure.  Botox NDC 9628-3662-94 Lot number [id number] Expiration date 10/2024 SP  She has acute migraine today.  I offered Toradol injection, has not found Toradol very helpful.  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  I did not have any Nurtec samples on hand, but I did send in a prescription, along with tizanidine.  I hope she will be able to take Nurtec, Aleve, tizanidine, Zofran and rest.  I did have Ubrelvy samples, I gave her 5 boxes to try.  If she continues to have a migraine next week, she can call for a migraine infusion.

## 2022-05-28 DIAGNOSIS — F4329 Adjustment disorder with other symptoms: Secondary | ICD-10-CM | POA: Diagnosis not present

## 2022-05-28 DIAGNOSIS — Z9109 Other allergy status, other than to drugs and biological substances: Secondary | ICD-10-CM | POA: Diagnosis not present

## 2022-05-28 DIAGNOSIS — N926 Irregular menstruation, unspecified: Secondary | ICD-10-CM | POA: Diagnosis not present

## 2022-05-28 DIAGNOSIS — F418 Other specified anxiety disorders: Secondary | ICD-10-CM | POA: Diagnosis not present

## 2022-05-28 DIAGNOSIS — Z0000 Encounter for general adult medical examination without abnormal findings: Secondary | ICD-10-CM | POA: Diagnosis not present

## 2022-05-28 DIAGNOSIS — G43909 Migraine, unspecified, not intractable, without status migrainosus: Secondary | ICD-10-CM | POA: Diagnosis not present

## 2022-06-10 ENCOUNTER — Telehealth: Payer: Self-pay

## 2022-06-10 NOTE — Telephone Encounter (Signed)
A PA was started on CMM for Nurtec 75 MG. Awaiting response. (Key: [id number]).

## 2022-06-11 NOTE — Telephone Encounter (Signed)
Request Reference Number: [id number]. NURTEC TAB 75MG  ODT is approved through 09/09/2022. Your patient may now fill this prescription and it will be covered.

## 2022-06-25 ENCOUNTER — Other Ambulatory Visit: Payer: Self-pay | Admitting: Neurology

## 2022-07-02 ENCOUNTER — Telehealth: Payer: Self-pay | Admitting: Pharmacy Technician

## 2022-07-02 ENCOUNTER — Encounter (HOSPITAL_COMMUNITY): Payer: Self-pay | Admitting: Pharmacy Technician

## 2022-07-02 NOTE — Telephone Encounter (Signed)
Patient Advocate Encounter   Received notification that prior authorization for Botox 200UNIT solution is required.   PA submitted on 07/02/2022 Key [id number] Status is pending       Kelly [name], CPhT Pharmacy Patient Advocate Specialist [facility] Patient Advocate Team Direct Number: [phone]  Fax: [phone]

## 2022-07-02 NOTE — Telephone Encounter (Signed)
Patient Advocate Encounter  Prior Authorization for Botox 200UNIT solution has been approved.    PA# [id number] Effective dates: 07/02/2022 through 10/01/2022  Must be filled at [facility]     Roland Earl, CPhT Pharmacy Patient Advocate Specialist [facility] Patient Advocate Team Direct Number: [phone]  Fax: [phone]

## 2022-07-04 NOTE — Telephone Encounter (Signed)
Pt has appt scheduled 08/15/22, botox refill on file

## 2022-07-17 ENCOUNTER — Telehealth: Payer: Self-pay | Admitting: Neurology

## 2022-07-17 NOTE — Telephone Encounter (Signed)
Kelly [name] From [facility] scheduled delivery for 07/23/22 Tuesday  [place] 1 Units 200

## 2022-08-15 ENCOUNTER — Ambulatory Visit (INDEPENDENT_AMBULATORY_CARE_PROVIDER_SITE_OTHER): Payer: BC Managed Care – PPO | Admitting: Neurology

## 2022-08-15 ENCOUNTER — Encounter: Payer: Self-pay | Admitting: Neurology

## 2022-08-15 VITALS — BP 161/101 | HR 90 | Ht 64.0 in | Wt 154.0 lb

## 2022-08-15 DIAGNOSIS — G43709 Chronic migraine without aura, not intractable, without status migrainosus: Secondary | ICD-10-CM

## 2022-08-15 MED ORDER — TIZANIDINE HCL 2 MG PO TABS: 2.0000 mg | ORAL_TABLET | Freq: Four times a day (QID) | ORAL | 1 refills | Status: DC | PRN

## 2022-08-15 MED ORDER — ONDANSETRON 4 MG PO TBDP: 4.0000 mg | ORAL_TABLET | Freq: Three times a day (TID) | ORAL | 3 refills | Status: AC | PRN

## 2022-08-15 MED ORDER — RIZATRIPTAN BENZOATE 10 MG PO TBDP: 10.0000 mg | ORAL_TABLET | ORAL | 11 refills | Status: AC | PRN

## 2022-08-15 MED ORDER — ONABOTULINUMTOXINA 200 UNITS IJ SOLR
200.0000 [IU] | Freq: Once | INTRAMUSCULAR | Status: AC
  Administered 2022-08-15: 200 [IU] via INTRAMUSCULAR

## 2022-08-15 NOTE — Progress Notes (Addendum)
    BOTOX PROCEDURE NOTE FOR MIGRAINE HEADACHE   HISTORY: Here today for her 2nd Botox injection. Last injection was 05/23/22 by me. Had huge improvement, only mild headaches for 1.5 months, few migraines last few weeks. They were manageable however. Was night and day difference with Botox, was dramatic. Uses Nurtec during the day, Maxalt at night, Ubrelvy wasn't didn't work as well.  She is very pleased.  Description of procedure:  The patient was placed in a sitting position. The standard protocol was used for Botox as follows, with 5 units of Botox injected at each site:  -Procerus muscle, midline injection  -Corrugator muscle, bilateral injection  -Frontalis muscle, bilateral injection, with 2 sites each side, medial injection was performed in the upper one third of the frontalis muscle, in the region vertical from the medial inferior edge of the superior orbital rim. The lateral injection was again in the upper one third of the forehead vertically above the lateral limbus of the cornea, 1.5 cm lateral to the medial injection site.  -Temporalis muscle injection, 4 sites, bilaterally. The first injection was 3 cm above the tragus of the ear, second injection site was 1.5 cm to 3 cm up from the first injection site in line with the tragus of the ear. The third injection site was 1.5-3 cm forward between the first 2 injection sites. The fourth injection site was 1.5 cm posterior to the second injection site.  -Occipitalis muscle injection, 3 sites, bilaterally. The first injection was done one half way between the occipital protuberance and the tip of the mastoid process behind the ear. The second injection site was done lateral and superior to the first, 1 fingerbreadth from the first injection. The third injection site was 1 fingerbreadth superiorly and medially from the first injection site.  -Cervical paraspinal muscle injection, 2 sites, bilateral, the first injection site was 1 cm from  the midline of the cervical spine, 3 cm inferior to the lower border of the occipital protuberance. The second injection site was 1.5 cm superiorly and laterally to the first injection site.  -Trapezius muscle injection was performed at 3 sites, bilaterally. The first injection site was in the upper trapezius muscle halfway between the inflection point of the neck, and the acromion. The second injection site was one half way between the acromion and the first injection site. The third injection was done between the first injection site and the inflection point of the neck.   A 200 unit bottle of Botox was used, 155 units were injected, the rest of the Botox was wasted. The patient tolerated the procedure well, there were no complications of the above procedure.  Botox NDC 1610-9604-54 Lot number [id number] Expiration date 11/2024 SP  Addendum 11/19/22 SS: Botox was unfortunately denied. She has had EXCELLENT benefit with Botox. Last Botox was 08/15/22, in the past month 2 headaches/ NO migraines. Prior to Botox was having continuous daily headache, 25 migraines a month. I feel Botox is necessary for clinical benefit, patient has had a HUGE benefit with Botox. Will appeal.

## 2022-08-15 NOTE — Progress Notes (Signed)
Botox 200 units x 1 vial Ndc-0023-3921-02 [id number] Exp-11-2024 SP 

## 2022-08-20 ENCOUNTER — Other Ambulatory Visit: Payer: Self-pay | Admitting: Obstetrics and Gynecology

## 2022-08-20 DIAGNOSIS — R928 Other abnormal and inconclusive findings on diagnostic imaging of breast: Secondary | ICD-10-CM

## 2022-08-23 ENCOUNTER — Telehealth: Payer: Self-pay | Admitting: Pharmacy Technician

## 2022-08-23 ENCOUNTER — Ambulatory Visit
Admission: RE | Admit: 2022-08-23 | Discharge: 2022-08-23 | Disposition: A | Discharge status: Home / Self Care | Payer: BC Managed Care – PPO | Source: Ambulatory Visit | Attending: Obstetrics and Gynecology | Admitting: Obstetrics and Gynecology

## 2022-08-23 DIAGNOSIS — R928 Other abnormal and inconclusive findings on diagnostic imaging of breast: Secondary | ICD-10-CM

## 2022-08-23 NOTE — Telephone Encounter (Signed)
Patient Advocate Encounter   Received notification that prior authorization for Nurtec 75MG dispersible tablets is required.   PA submitted on 08/23/2022 Key [id number] Status is pending       Lyndel Safe, [place] Patient Advocate Specialist [place] Patient Advocate Team Direct Number: [phone]  Fax: [phone]

## 2022-08-27 NOTE — Telephone Encounter (Signed)
Patient Advocate Encounter  Prior Authorization for Nurtec 75MG dispersible tablets has been approved.    PA# [id number] Effective dates: 08/23/2022 through 08/24/2023      Lyndel Safe, [place] Patient Advocate Specialist [place] Patient Advocate Team Direct Number: [phone]  Fax: [phone]

## 2022-09-02 ENCOUNTER — Other Ambulatory Visit: Payer: BC Managed Care – PPO

## 2022-09-16 ENCOUNTER — Other Ambulatory Visit (HOSPITAL_COMMUNITY): Payer: Self-pay

## 2022-09-30 ENCOUNTER — Other Ambulatory Visit: Payer: Self-pay | Admitting: Neurology

## 2022-10-21 ENCOUNTER — Telehealth: Payer: Self-pay | Admitting: Neurology

## 2022-10-21 NOTE — Telephone Encounter (Signed)
New Botox auth needed before 5/14 appt, current auth expired 09/2022.

## 2022-10-24 NOTE — Telephone Encounter (Signed)
error 

## 2022-10-31 ENCOUNTER — Other Ambulatory Visit (HOSPITAL_COMMUNITY): Payer: Self-pay

## 2022-10-31 ENCOUNTER — Telehealth: Payer: Self-pay | Admitting: Pharmacy Technician

## 2022-10-31 NOTE — Telephone Encounter (Signed)
Status of PA in separate encounter 

## 2022-10-31 NOTE — Telephone Encounter (Signed)
BotoxOne Benefit Verification [id number] Submitted

## 2022-10-31 NOTE — Telephone Encounter (Signed)
Patient Advocate Encounter   Received notification that prior authorization for Botox 200UNIT solution is required.   PA submitted on 10/31/2022 Key [id number] Insurance [facility] Electronic Prior Authorization Form Status is pending

## 2022-11-04 ENCOUNTER — Other Ambulatory Visit (HOSPITAL_COMMUNITY): Payer: Self-pay

## 2022-11-04 NOTE — Telephone Encounter (Signed)
Benefit Verification [id number] Submitted! BOTOX ONE-  Submitted new BIV report-previous insurance was no longer active.

## 2022-11-04 NOTE — Telephone Encounter (Signed)
Pharmacy Patient Advocate Encounter   Prior authorization for Botox 200 Units is required/requested.   PA submitted on 11/04/2022 to (ins) [facility] via CoverMyMeds Key or (Medicaid) confirmation #  [id number]  Status is pending  Submitted new Pa with updated insurance information.

## 2022-11-10 ENCOUNTER — Other Ambulatory Visit: Payer: Self-pay | Admitting: Neurology

## 2022-11-11 NOTE — Telephone Encounter (Signed)
Checking status of auth, thank you.

## 2022-11-13 ENCOUNTER — Encounter: Payer: Self-pay | Admitting: Neurology

## 2022-11-13 ENCOUNTER — Other Ambulatory Visit (HOSPITAL_COMMUNITY): Payer: Self-pay

## 2022-11-13 NOTE — Telephone Encounter (Signed)
Pt stated she wants to talk to nurse about appealing Botox through insurance

## 2022-11-13 NOTE — Telephone Encounter (Signed)
Pharmacy Patient Advocate Encounter  Received notification from [facility] that the request for prior authorization for Botox 200 Units has been denied due to See below.         Please be advised we currently do not have a Pharmacist to review denials, therefore you will need to process appeals accordingly as needed. Thanks for your support at this time.   You may call  or fax , to appeal.

## 2022-11-13 NOTE — Telephone Encounter (Signed)
Please see messages about denial.

## 2022-11-18 NOTE — Telephone Encounter (Signed)
Pt stated that she has had 2 headache days in the past month she stated that in the past month that she hasn't had any migraines. when prior to botox she had a continuous migraine 25 days per month. The stated botox has made such a tremendous difference.  Routing to sarah slack to make aware.

## 2022-11-18 NOTE — Telephone Encounter (Signed)
Called appeals department and they state a new PA is the fastest way to get approval, but last office note did not reflect the reasons for denial. If you can addend last note and add any information that may help we can resubmit for a better chance of approval. We need the evidence of the 4 reasons given in denial in the visit note

## 2022-11-19 ENCOUNTER — Ambulatory Visit: Payer: Self-pay | Admitting: Neurology

## 2022-11-19 ENCOUNTER — Other Ambulatory Visit (HOSPITAL_COMMUNITY): Payer: Self-pay

## 2022-11-19 NOTE — Telephone Encounter (Signed)
Pharmacy Patient Advocate Encounter   Received notification from [facility] that prior authorization for Botox 200 units is required/requested.   PA submitted on 11/19/2022 to (ins) [facility] via CoverMyMeds Key or (Medicaid) confirmation # [id number]  Status is pending

## 2022-11-21 NOTE — Telephone Encounter (Signed)
New pa submitted for botox:

## 2022-11-21 NOTE — Telephone Encounter (Signed)
Can we please appeal.  I attached an addendum to the last procedure note specifically addressing the listed reason for denials.  Addendum 11/19/22 SS: Botox was unfortunately denied. She has had EXCELLENT benefit with Botox. Last Botox was 08/15/22, in the past month 2 headaches/ NO migraines. Prior to Botox was having continuous daily headache, 25 migraines a month. I feel Botox is necessary for clinical benefit, patient has had a HUGE benefit with Botox.  Since Botox, she has not had to utilize her rescue medication since she has had 0 migraines.  She is not overusing any prescription or over-the-counter medications that would cause medication overuse headache.  Will appeal.

## 2022-11-21 NOTE — Telephone Encounter (Signed)
  Awaiting the email with the questions

## 2022-11-21 NOTE — Telephone Encounter (Signed)
This was how the  person who completed the pa answered the question:

## 2022-11-21 NOTE — Telephone Encounter (Signed)
Pharmacy Patient Advocate Encounter  Received notification from [facility] that the request for prior authorization for Botox has been denied due to See below.       Please be advised we currently do not have a Pharmacist to review denials, therefore you will need to process appeals accordingly as needed. Thanks for your support at this time.   You may call [phone] or fax [phone], to appeal.   They are looking for very specific documentation-will need to be updated before trying to resubmit or the office can do an appeal.

## 2022-11-25 ENCOUNTER — Telehealth: Payer: Self-pay | Admitting: Neurology

## 2022-11-25 NOTE — Telephone Encounter (Signed)
Pt stated she is following-up on Botox denial. Requesting a call back from nurse.

## 2022-12-03 ENCOUNTER — Ambulatory Visit (INDEPENDENT_AMBULATORY_CARE_PROVIDER_SITE_OTHER): Payer: BC Managed Care – PPO | Admitting: Neurology

## 2022-12-03 VITALS — BP 155/94 | HR 85 | Ht 64.0 in | Wt 157.0 lb

## 2022-12-03 DIAGNOSIS — G43709 Chronic migraine without aura, not intractable, without status migrainosus: Secondary | ICD-10-CM | POA: Diagnosis not present

## 2022-12-03 MED ORDER — ONABOTULINUMTOXINA 200 UNITS IJ SOLR
155.0000 [IU] | Freq: Once | INTRAMUSCULAR | Status: AC
  Administered 2022-12-03: 155 [IU] via INTRAMUSCULAR

## 2022-12-03 MED ORDER — TIZANIDINE HCL 2 MG PO TABS: 2.0000 mg | ORAL_TABLET | Freq: Four times a day (QID) | ORAL | 5 refills | Status: DC | PRN

## 2022-12-03 NOTE — Progress Notes (Signed)
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

## 2022-12-03 NOTE — Progress Notes (Signed)
BOTOX PROCEDURE NOTE FOR MIGRAINE HEADACHE  HISTORY: Kelly [name] is here today for her third Botox injection.  Last was 08/15/2022.  She has had excellent benefit.  She has not had any migraines, in the last month has had 2 headaches.  Prior to Botox was having continuous daily headache, 25 migraines a month.  Has Nurtec and Maxalt if needed for rescue but has not had to utilize due to excellent Botox benefit.  We had to appeal but Botox was approved again.  She has had 95% improvement.  Description of procedure:  The patient was placed in a sitting position. The standard protocol was used for Botox as follows, with 5 units of Botox injected at each site:   -Procerus muscle, midline injection  -Corrugator muscle, bilateral injection  -Frontalis muscle, bilateral injection, with 2 sites each side, medial injection was performed in the upper one third of the frontalis muscle, in the region vertical from the medial inferior edge of the superior orbital rim. The lateral injection was again in the upper one third of the forehead vertically above the lateral limbus of the cornea, 1.5 cm lateral to the medial injection site.  -Temporalis muscle injection, 4 sites, bilaterally. The first injection was 3 cm above the tragus of the ear, second injection site was 1.5 cm to 3 cm up from the first injection site in line with the tragus of the ear. The third injection site was 1.5-3 cm forward between the first 2 injection sites. The fourth injection site was 1.5 cm posterior to the second injection site.  -Occipitalis muscle injection, 3 sites, bilaterally. The first injection was done one half way between the occipital protuberance and the tip of the mastoid process behind the ear. The second injection site was done lateral and superior to the first, 1 fingerbreadth from the first injection. The third injection site was 1 fingerbreadth superiorly and medially from the first injection site.  -Cervical paraspinal  muscle injection, 2 sites, bilateral, the first injection site was 1 cm from the midline of the cervical spine, 3 cm inferior to the lower border of the occipital protuberance. The second injection site was 1.5 cm superiorly and laterally to the first injection site.  -Trapezius muscle injection was performed at 3 sites, bilaterally. The first injection site was in the upper trapezius muscle halfway between the inflection point of the neck, and the acromion. The second injection site was one half way between the acromion and the first injection site. The third injection was done between the first injection site and the inflection point of the neck.  A 200 unit bottle of Botox was used, 155 units were injected, the rest of the Botox was wasted. The patient tolerated the procedure well, there were no complications of the above procedure.  Botox NDC 1610-9604-54 Lot number [id number] Expiration date 05/2025 SP  I refilled her tizanidine.  She is taking 2 mg nightly.  She was previously having tension to her right jaw that was triggering migraine headache.  She continues to wear her mouthguard.  Meds ordered this encounter  Medications   botulinum toxin Type A (BOTOX) injection 155 Units    Botox- 200 units x 1 vial Lot: [id number] Expiration: 2026/11 NDC: 0023-3921-02  Bacteriostatic 0.9% Sodium Chloride- 4mL  Lot: [id number] Expiration: 1/APR/2025 NDC: 6213-0865-78  Dx: I69.629 S/P Witnessed by Athena Masse RN   tiZANidine (ZANAFLEX) 2 MG tablet    Sig: Take 1 tablet (2 mg total) by mouth every 6 (six) hours  as needed for muscle spasms.    Dispense:  30 tablet    Refill:  5

## 2023-02-17 NOTE — Telephone Encounter (Signed)
Submitted new auth via cmm, status is pending. Key: [id number]

## 2023-02-18 NOTE — Telephone Encounter (Signed)
FYI- [facility] is denying Botox again stating that patient needs to try/fail either Ajovy or Aimovig. I submitted an urgent appeal with notes showing that pt has tried and failed Ajovy as well as notes displaying that Botox has given her great relief. Will update once a decision has been made.   CMM Key: [id number]

## 2023-02-19 NOTE — Telephone Encounter (Signed)
Received fax this morning that denial was overturned.  Auth: [id number] (02/18/23-05/21/23)

## 2023-03-04 ENCOUNTER — Ambulatory Visit (INDEPENDENT_AMBULATORY_CARE_PROVIDER_SITE_OTHER): Payer: BC Managed Care – PPO | Admitting: Neurology

## 2023-03-04 VITALS — BP 161/118

## 2023-03-04 DIAGNOSIS — G43709 Chronic migraine without aura, not intractable, without status migrainosus: Secondary | ICD-10-CM

## 2023-03-04 MED ORDER — ONABOTULINUMTOXINA 200 UNITS IJ SOLR
155.0000 [IU] | Freq: Once | INTRAMUSCULAR | Status: AC
Start: 2023-03-04 — End: 2023-03-04
  Administered 2023-03-04: 155 [IU] via INTRAMUSCULAR

## 2023-03-04 MED ORDER — NURTEC 75 MG PO TBDP: 1.0000 | ORAL_TABLET | ORAL | 0 refills | Status: DC

## 2023-03-04 NOTE — Progress Notes (Signed)
   BOTOX PROCEDURE NOTE FOR MIGRAINE HEADACHE  HISTORY: Here for Botox injection.  This will be her fourth cycle. Last was 12/03/22 by me.  Did great the first 2 months.  The last several weeks the Botox has worn off.  She did not had any migraines the first 2 months.  She utilizes Nurtec with excellent benefit.  Is very expensive.  Takes tizanidine nightly for preventative.  A lot of stress with care giving for her parents.   Description of procedure:  The patient was placed in a sitting position. The standard protocol was used for Botox as follows, with 5 units of Botox injected at each site:  -Procerus muscle, midline injection  -Corrugator muscle, bilateral injection  -Frontalis muscle, bilateral injection, with 2 sites each side, medial injection was performed in the upper one third of the frontalis muscle, in the region vertical from the medial inferior edge of the superior orbital rim. The lateral injection was again in the upper one third of the forehead vertically above the lateral limbus of the cornea, 1.5 cm lateral to the medial injection site.  -Temporalis muscle injection, 4 sites, bilaterally. The first injection was 3 cm above the tragus of the ear, second injection site was 1.5 cm to 3 cm up from the first injection site in line with the tragus of the ear. The third injection site was 1.5-3 cm forward between the first 2 injection sites. The fourth injection site was 1.5 cm posterior to the second injection site.  -Occipitalis muscle injection, 3 sites, bilaterally. The first injection was done one half way between the occipital protuberance and the tip of the mastoid process behind the ear. The second injection site was done lateral and superior to the first, 1 fingerbreadth from the first injection. The third injection site was 1 fingerbreadth superiorly and medially from the first injection site.  -Cervical paraspinal muscle injection, 2 sites, bilateral, the first injection  site was 1 cm from the midline of the cervical spine, 3 cm inferior to the lower border of the occipital protuberance. The second injection site was 1.5 cm superiorly and laterally to the first injection site.  -Trapezius muscle injection was performed at 3 sites, bilaterally. The first injection site was in the upper trapezius muscle halfway between the inflection point of the neck, and the acromion. The second injection site was one half way between the acromion and the first injection site. The third injection was done between the first injection site and the inflection point of the neck.   A 200 unit bottle of Botox was used, 155 units were injected, the rest of the Botox was wasted. The patient tolerated the procedure well, there were no complications of the above procedure.  Botox NDC 1610-9604-54 Lot number [id number] Expiration date 06/2025 SP  We gave her # 2 boxes of Nurtec

## 2023-03-04 NOTE — Progress Notes (Signed)
Botox- 200 units x 1 vial Lot: [id number] Expiration: 2026/12 NDC: 0023-3921-02  Bacteriostatic 0.9% Sodium Chloride- 4 mL  Lot: [id number] Expiration: 10/07/23 NDC: 0454-0981-19  Dx: J47.829 S/P  Witnessed by Athena Masse RN

## 2023-04-28 NOTE — Telephone Encounter (Signed)
Submitted auth renewal request via cmm, status is pending.  Key: [id number]

## 2023-04-30 NOTE — Telephone Encounter (Signed)
Received fax that PA was approved.  Auth#: [id number] (04/28/23-07/30/23)

## 2023-05-06 ENCOUNTER — Other Ambulatory Visit: Payer: Self-pay | Admitting: Neurology

## 2023-05-06 NOTE — Telephone Encounter (Signed)
Rx refilled per last office visit note.

## 2023-05-28 ENCOUNTER — Ambulatory Visit (INDEPENDENT_AMBULATORY_CARE_PROVIDER_SITE_OTHER): Payer: BC Managed Care – PPO | Admitting: Neurology

## 2023-05-28 ENCOUNTER — Other Ambulatory Visit: Payer: Self-pay | Admitting: Neurology

## 2023-05-28 VITALS — BP 140/98 | HR 69

## 2023-05-28 DIAGNOSIS — G43709 Chronic migraine without aura, not intractable, without status migrainosus: Secondary | ICD-10-CM | POA: Diagnosis not present

## 2023-05-28 MED ORDER — NURTEC 75 MG PO TBDP: 75.0000 mg | ORAL_TABLET | ORAL | 11 refills | Status: DC

## 2023-05-28 MED ORDER — ONABOTULINUMTOXINA 200 UNITS IJ SOLR
155.0000 [IU] | Freq: Once | INTRAMUSCULAR | Status: AC
Start: 2023-05-28 — End: 2023-05-28
  Administered 2023-05-28: 155 [IU] via INTRAMUSCULAR

## 2023-05-28 NOTE — Progress Notes (Signed)
Botox- 200 units x 1 vial Lot: [id number] Expiration: 03.2027 NDC: 0023-3921-02  Bacteriostatic 0.9% Sodium Chloride- 4 mL  Lot: [id number]   Expiration: 04.01.2025 NDC: 1324401027  Dx: G34.709. S/P Witnessed by April, RN

## 2023-05-28 NOTE — Progress Notes (Signed)
   BOTOX PROCEDURE NOTE FOR MIGRAINE HEADACHE  HISTORY: Last Botox was 03/04/2023 with me.  This will be her fifth Botox.  Last 2 weeks had return of Botox. Otherwise during Botox cycle has ZERO migraines!! Has Nurtec as needed, but is expensive so limits refills.  Botox has been life-changing.  Description of procedure:  The patient was placed in a sitting position. The standard protocol was used for Botox as follows, with 5 units of Botox injected at each site:   -Procerus muscle, midline injection  -Corrugator muscle, bilateral injection  -Frontalis muscle, bilateral injection, with 2 sites each side, medial injection was performed in the upper one third of the frontalis muscle, in the region vertical from the medial inferior edge of the superior orbital rim. The lateral injection was again in the upper one third of the forehead vertically above the lateral limbus of the cornea, 1.5 cm lateral to the medial injection site.  -Temporalis muscle injection, 4 sites, bilaterally. The first injection was 3 cm above the tragus of the ear, second injection site was 1.5 cm to 3 cm up from the first injection site in line with the tragus of the ear. The third injection site was 1.5-3 cm forward between the first 2 injection sites. The fourth injection site was 1.5 cm posterior to the second injection site.  -Occipitalis muscle injection, 3 sites, bilaterally. The first injection was done one half way between the occipital protuberance and the tip of the mastoid process behind the ear. The second injection site was done lateral and superior to the first, 1 fingerbreadth from the first injection. The third injection site was 1 fingerbreadth superiorly and medially from the first injection site.  -Cervical paraspinal muscle injection, 2 sites, bilateral, the first injection site was 1 cm from the midline of the cervical spine, 3 cm inferior to the lower border of the occipital protuberance. The second  injection site was 1.5 cm superiorly and laterally to the first injection site.  -Trapezius muscle injection was performed at 3 sites, bilaterally. The first injection site was in the upper trapezius muscle halfway between the inflection point of the neck, and the acromion. The second injection site was one half way between the acromion and the first injection site. The third injection was done between the first injection site and the inflection point of the neck.  A 200 unit bottle of Botox was used, 155 units were injected, the rest of the Botox was wasted. The patient tolerated the procedure well, there were no complications of the above procedure.  Botox NDC 1610-9604-54 Lot number [id number] Expiration date 09/2025 SP  We will continue Botox for migraine prevention.  Will add in Nurtec every other day to hopefully improve wearing off migraines towards the end of Botox cycle.  Meds ordered this encounter  Medications   botulinum toxin Type A (BOTOX) injection 155 Units    Botox- 200 units x 1 vial Lot: [id number] Expiration: 03.2027 NDC: 0023-3921-02 SP   Rimegepant Sulfate (NURTEC) 75 MG TBDP    Sig: Take 1 tablet (75 mg total) by mouth every other day.    Dispense:  16 tablet    Refill:  11

## 2023-05-29 ENCOUNTER — Telehealth: Payer: Self-pay | Admitting: *Deleted

## 2023-05-29 NOTE — Telephone Encounter (Signed)
Pharmacy states PA nurtec needed

## 2023-05-30 ENCOUNTER — Other Ambulatory Visit (HOSPITAL_COMMUNITY): Payer: Self-pay

## 2023-05-30 ENCOUNTER — Telehealth: Payer: Self-pay

## 2023-05-30 NOTE — Telephone Encounter (Signed)
Pharmacy Patient Advocate Encounter   Received notification from Physician's Office that prior authorization for Nurtec 75MG  dispersible tablets is required/requested.   Insurance verification completed.   The patient is insured through [facility] .   Per test claim: PA required; PA submitted to above mentioned insurance via CoverMyMeds Key/confirmation #/EOC [id number] Status is pending

## 2023-05-30 NOTE — Telephone Encounter (Signed)
PA request has been Submitted. New Encounter created for follow up. For additional info see Pharmacy Prior Auth telephone encounter from 05/30/2023.

## 2023-06-02 ENCOUNTER — Other Ambulatory Visit (HOSPITAL_COMMUNITY): Payer: Self-pay

## 2023-06-02 NOTE — Telephone Encounter (Signed)
Pharmacy Patient Advocate Encounter  Received notification from [facility] that Prior Authorization for Nurtec 75MG  dispersible tablets has been APPROVED from 05/31/2023 to 08/24/2023. Ran test claim, Copay is $19.97. This test claim was processed through [facility]- copay amounts may vary at other pharmacies due to pharmacy/plan contracts, or as the patient moves through the different stages of their insurance plan.   PA #/Case ID/Reference #: PA Case ID #: [id number]

## 2023-07-24 ENCOUNTER — Emergency Department (HOSPITAL_BASED_OUTPATIENT_CLINIC_OR_DEPARTMENT_OTHER): Payer: BC Managed Care – PPO | Admitting: Radiology

## 2023-07-24 ENCOUNTER — Emergency Department (HOSPITAL_BASED_OUTPATIENT_CLINIC_OR_DEPARTMENT_OTHER)
Admission: EM | Admit: 2023-07-24 | Discharge: 2023-07-25 | Disposition: A | Discharge status: Home / Self Care | Payer: BC Managed Care – PPO | Attending: Emergency Medicine | Admitting: Emergency Medicine

## 2023-07-24 ENCOUNTER — Other Ambulatory Visit: Payer: Self-pay

## 2023-07-24 DIAGNOSIS — R079 Chest pain, unspecified: Secondary | ICD-10-CM | POA: Diagnosis present

## 2023-07-24 DIAGNOSIS — Z91040 Latex allergy status: Secondary | ICD-10-CM | POA: Diagnosis not present

## 2023-07-24 DIAGNOSIS — J181 Lobar pneumonia, unspecified organism: Secondary | ICD-10-CM | POA: Insufficient documentation

## 2023-07-24 DIAGNOSIS — J189 Pneumonia, unspecified organism: Secondary | ICD-10-CM

## 2023-07-24 MED ORDER — SODIUM CHLORIDE 0.9 % IV SOLN
1.0000 g | Freq: Once | INTRAVENOUS | Status: AC
  Administered 2023-07-24: 1 g via INTRAVENOUS
  Filled 2023-07-24: qty 10

## 2023-07-24 MED ORDER — SODIUM CHLORIDE 0.9 % IV SOLN
500.0000 mg | Freq: Once | INTRAVENOUS | Status: AC
  Administered 2023-07-24: 500 mg via INTRAVENOUS
  Filled 2023-07-24: qty 5

## 2023-07-24 MED ORDER — SODIUM CHLORIDE 0.9 % IV BOLUS
1000.0000 mL | Freq: Once | INTRAVENOUS | Status: AC
  Administered 2023-07-24: 1000 mL via INTRAVENOUS

## 2023-07-24 NOTE — ED Provider Notes (Signed)
[place] EMERGENCY DEPARTMENT AT [facility]  Provider Note  CSN: [id number] Arrival date & time: 07/24/23 4098  History Chief Complaint  Patient presents with   Pneumonia   Chest Pain    Kelly [name] is a 43 y.o. female with no significant PMH reports she has been sick for about a week, saw her PCP on 1/14 and diagnosed with pneumonia and started on doxycycline. She has had 6 total doses without improvement, now also having pleuritic R lower chest pain with coughing. She has not had any further fevers since starting Abx. Feels SOB at times, has been using inhaler with minimal improvement.    Home Medications Prior to Admission medications   Medication Sig Start Date End Date Taking? Authorizing Provider  amoxicillin-clavulanate (AUGMENTIN) 875-125 MG tablet Take 1 tablet by mouth every 12 (twelve) hours. 07/25/23  Yes Pollyann Savoy, MD  ALPRAZolam Prudy Feeler) 0.25 MG tablet Take 1 tablet (0.25 mg total) by mouth 2 (two) times daily as needed for anxiety. 11/20/19   Harold Hedge, MD  botulinum toxin Type A (BOTOX) 200 units injection INJECT 155 UNITS INTRAMUSCULARLY INTO HEAD AND NECK EVERY 12  WEEKS 05/06/23   Glean Salvo, NP  labetalol (NORMODYNE) 100 MG tablet Take 200 mg by mouth in the morning and at bedtime. 04/24/20   [provider]  ondansetron (ZOFRAN-ODT) 4 MG disintegrating tablet Take 1-2 tablets (4-8 mg total) by mouth every 8 (eight) hours as needed. 08/15/22   Glean Salvo, NP  progesterone (PROMETRIUM) 100 MG capsule Take 1 tablet by mouth daily.    [provider]  Rimegepant Sulfate (NURTEC) 75 MG TBDP Take 1 tablet (75 mg total) by mouth every other day. 05/28/23   Glean Salvo, NP  rizatriptan (MAXALT-MLT) 10 MG disintegrating tablet Take 1 tablet (10 mg total) by mouth as needed for migraine. May repeat in 2 hours if needed 08/15/22   Glean Salvo, NP  tiZANidine (ZANAFLEX) 2 MG tablet Take 1 tablet (2 mg total) by mouth every 6  (six) hours as needed for muscle spasms. 12/03/22   Glean Salvo, NP  VENTOLIN HFA 108 (90 Base) MCG/ACT inhaler Inhale 1 puff into the lungs every 4 (four) hours as needed for wheezing or shortness of breath. 09/27/19   [provider]     Allergies    Latex and Prednisone   Review of Systems   Review of Systems Please see HPI for pertinent positives and negatives  Physical Exam BP (!) 160/98   Pulse 80   Temp 98.7 F (37.1 C) (Oral)   Resp 16   Ht 5\' 4"  (1.626 m)   Wt 68 kg   SpO2 100%   BMI 25.75 kg/m   Physical Exam Vitals and nursing note reviewed.  Constitutional:      Appearance: Normal appearance.  HENT:     Head: Normocephalic and atraumatic.     Nose: Nose normal.     Mouth/Throat:     Mouth: Mucous membranes are moist.  Eyes:     Extraocular Movements: Extraocular movements intact.     Conjunctiva/sclera: Conjunctivae normal.  Cardiovascular:     Rate and Rhythm: Normal rate.  Pulmonary:     Effort: Pulmonary effort is normal.     Breath sounds: Examination of the right-lower field reveals rales. Rales present.  Abdominal:     General: Abdomen is flat.     Palpations: Abdomen is soft.     Tenderness: There  is no abdominal tenderness.  Musculoskeletal:        General: No swelling. Normal range of motion.     Cervical back: Neck supple.  Skin:    General: Skin is warm and dry.  Neurological:     General: No focal deficit present.     Mental Status: She is alert.  Psychiatric:        Mood and Affect: Mood normal.     ED Results / Procedures / Treatments   EKG EKG Interpretation Date/Time:  Thursday July 24 2023 18:47:21 EST Ventricular Rate:  96 PR Interval:  122 QRS Duration:  90 QT Interval:  330 QTC Calculation: 416 R Axis:   67  Text Interpretation: Normal sinus rhythm Nonspecific ST abnormality Abnormal ECG When compared with ECG of 01-May-2020 12:38, No significant change since last tracing Confirmed by Susy Frizzle  [phone]) on 07/24/2023 11:13:04 PM  Procedures Procedures  Medications Ordered in the ED Medications  sodium chloride 0.9 % bolus 1,000 mL (0 mLs Intravenous Stopped 07/25/23 0058)  cefTRIAXone (ROCEPHIN) 1 g in sodium chloride 0.9 % 100 mL IVPB (0 g Intravenous Stopped 07/25/23 0037)  azithromycin (ZITHROMAX) 500 mg in sodium chloride 0.9 % 250 mL IVPB (500 mg Intravenous New Bag/Given 07/24/23 2354)    Initial Impression and Plan  Patient here with known CAP from outpatient xray not improving after 3 days of oral antibiotics. She is not hypoxic here. I personally viewed the images from radiology studies and agree with radiologist interpretation: CXR shows R sided infiltrates, unable to compare to PCP office. Will check labs, cultures, begin IVF and IV Abx and then reassess for dispo.  ED Course   Clinical Course as of 07/25/23 0101  Fri Jul 25, 2023  0005 CBC with normal WBC.  [CS]  0046 CMP is normal.  [CS]  0059 On reassessment patient reports she is feeling some better. She has not had any hypoxia here. We discussed inpatient vs outpatient management and she would prefer to go home. She already has hydrocodone cough syrup, will switch Abx to Augmentin, recommend she monitor SpO2 at home, PCP follow up, RTED for any other concerns.   [CS]    Clinical Course User Index [CS] Pollyann Savoy, MD     MDM Rules/Calculators/A&P Medical Decision Making Problems Addressed: Community acquired pneumonia of right lower lobe of lung: acute illness or injury  Amount and/or Complexity of Data Reviewed Labs: ordered. Decision-making details documented in ED Course. Radiology: ordered and independent interpretation performed. Decision-making details documented in ED Course.  Risk Prescription drug management. Decision regarding hospitalization.     Final Clinical Impression(s) / ED Diagnoses Final diagnoses:  Community acquired pneumonia of right lower lobe of lung    Rx / DC  Orders ED Discharge Orders          Ordered    amoxicillin-clavulanate (AUGMENTIN) 875-125 MG tablet  Every 12 hours        07/25/23 0101             Pollyann Savoy, MD 07/25/23 [phone]

## 2023-07-24 NOTE — ED Triage Notes (Signed)
Pt POV from home reporting mid chest pressure and SOB, dx pneumonia Tuesday, taking abx as prescribed. Labored in triage. Hx asthma using inhaler w no improvement.

## 2023-07-25 ENCOUNTER — Encounter (HOSPITAL_BASED_OUTPATIENT_CLINIC_OR_DEPARTMENT_OTHER): Payer: Self-pay

## 2023-07-25 LAB — COMPREHENSIVE METABOLIC PANEL
ALT: 26 U/L (ref 0–44)
AST: 25 U/L (ref 15–41)
Albumin: 4.3 g/dL (ref 3.5–5.0)
Alkaline Phosphatase: 67 U/L (ref 38–126)
Anion gap: 10 (ref 5–15)
BUN: 9 mg/dL (ref 6–20)
CO2: 27 mmol/L (ref 22–32)
Calcium: 9.2 mg/dL (ref 8.9–10.3)
Chloride: 102 mmol/L (ref 98–111)
Creatinine, Ser: 0.8 mg/dL (ref 0.44–1.00)
GFR, Estimated: >60 mL/min (ref >60)
Glucose, Bld: 84 mg/dL (ref 70–99)
Potassium: 3.5 mmol/L (ref 3.5–5.1)
Sodium: 139 mmol/L (ref 135–145)
Total Bilirubin: 0.3 mg/dL (ref 0.0–1.2)
Total Protein: 7.4 g/dL (ref 6.5–8.1)

## 2023-07-25 LAB — CBC WITH DIFFERENTIAL/PLATELET
Abs Immature Granulocytes: 0.03 10*3/uL (ref 0.00–0.07)
Basophils Absolute: 0 10*3/uL (ref 0.0–0.1)
Basophils Relative: 0 %
Eosinophils Absolute: 0.4 10*3/uL (ref 0.0–0.5)
Eosinophils Relative: 4 %
HCT: 35.4 % — ABNORMAL LOW (ref 36.0–46.0)
Hemoglobin: 11.9 g/dL — ABNORMAL LOW (ref 12.0–15.0)
Immature Granulocytes: 0 %
Lymphocytes Relative: 21 %
Lymphs Abs: 2 10*3/uL (ref 0.7–4.0)
MCH: 28.5 pg (ref 26.0–34.0)
MCHC: 33.6 g/dL (ref 30.0–36.0)
MCV: 84.7 fL (ref 80.0–100.0)
Monocytes Absolute: 0.5 10*3/uL (ref 0.1–1.0)
Monocytes Relative: 5 %
Neutro Abs: 6.6 10*3/uL (ref 1.7–7.7)
Neutrophils Relative %: 70 %
Platelets: 518 10*3/uL — ABNORMAL HIGH (ref 150–400)
RBC: 4.18 MIL/uL (ref 3.87–5.11)
RDW: 12.3 % (ref 11.5–15.5)
WBC: 9.6 10*3/uL (ref 4.0–10.5)
nRBC: 0 % (ref 0.0–0.2)

## 2023-07-25 MED ORDER — AMOXICILLIN-POT CLAVULANATE 875-125 MG PO TABS: 1.0000 | ORAL_TABLET | Freq: Two times a day (BID) | ORAL | 0 refills | Status: AC

## 2023-07-30 LAB — CULTURE, BLOOD (ROUTINE X 2)
Culture: NO GROWTH
Culture: NO GROWTH
Special Requests: ADEQUATE
Special Requests: ADEQUATE

## 2023-08-01 ENCOUNTER — Encounter: Payer: Self-pay | Admitting: Emergency Medicine

## 2023-08-01 ENCOUNTER — Other Ambulatory Visit: Payer: Self-pay | Admitting: Family Medicine

## 2023-08-01 ENCOUNTER — Ambulatory Visit
Admission: EM | Admit: 2023-08-01 | Discharge: 2023-08-01 | Disposition: A | Discharge status: Home / Self Care | Payer: BC Managed Care – PPO | Attending: Family Medicine | Admitting: Family Medicine

## 2023-08-01 DIAGNOSIS — J4521 Mild intermittent asthma with (acute) exacerbation: Secondary | ICD-10-CM | POA: Diagnosis not present

## 2023-08-01 DIAGNOSIS — J22 Unspecified acute lower respiratory infection: Secondary | ICD-10-CM | POA: Diagnosis not present

## 2023-08-01 MED ORDER — DOXYCYCLINE HYCLATE 100 MG PO CAPS
100.0000 mg | ORAL_CAPSULE | Freq: Two times a day (BID) | ORAL | 0 refills | Status: AC
Start: 2023-08-01 — End: ?

## 2023-08-01 MED ORDER — PROMETHAZINE-DM 6.25-15 MG/5ML PO SYRP: 5.0000 mL | ORAL_SOLUTION | Freq: Four times a day (QID) | ORAL | 0 refills | Status: AC | PRN

## 2023-08-01 MED ORDER — FLUTICASONE PROPIONATE HFA 110 MCG/ACT IN AERO: 2.0000 | INHALATION_SPRAY | Freq: Two times a day (BID) | RESPIRATORY_TRACT | 0 refills | Status: AC

## 2023-08-01 NOTE — ED Triage Notes (Signed)
Cough, fever, feels weak x 2 weeks.  hx of pneumonia about 2 weeks ago.  States she is currently on Augmentin

## 2023-08-01 NOTE — ED Provider Notes (Signed)
[facility]    CSN: [id number] Arrival date & time: 08/01/23  1541      History   Chief Complaint No chief complaint on file.   HPI Kelly [name] is a 43 y.o. female.   Patient presenting today with 2-week history of progressively worsening productive cough, intermittent fevers, weakness, chest tightness, shortness of breath.  Was seen in the emergency department on 07/24/2023 and diagnosed with pneumonia, placed on Augmentin which she states maybe helped slightly initially but symptoms have progressed since then.  Denies chest pain, abdominal pain, vomiting, diarrhea.  Has also been taking Mucinex with minimal relief.  She of asthma on albuterol as needed.    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   Osteonecrosis (HCC)    Raynaud's disease    TMJ locking    right side    Patient Active Problem List   Diagnosis Date Noted   Chronic migraine w/o aura w/o status migrainosus, not intractable 04/09/2022   IUFD at 20 weeks or more of gestation 11/17/2019   S/P cesarean section 09/14/2015   Postpartum care following cesarean delivery 07/20/2014   Gestational hypertension 07/18/2014   Abdominal pain affecting pregnancy    Gestational hypertension without significant proteinuria in third trimester    [redacted] weeks gestation of pregnancy    [redacted] weeks gestation of pregnancy    Gestational hypertension w/o significant proteinuria in 3rd trimester    Encounter for fetal anatomic survey    Group beta Strep positive 05/18/2014   Abdominal pain in pregnancy, antepartum    [redacted] weeks gestation of pregnancy    Weight loss 12/03/2011    Osteonecrosis of left knee region (HCC) 11/28/2011   Osteonecrosis of right knee region (HCC) 11/28/2011   Chronic pain 11/28/2011   Attention deficit disorder with hyperactivity 11/28/2011   Raynaud's disease 11/28/2011   Rheumatoid arthritis (HCC) 11/28/2011    Past Surgical History:  Procedure Laterality Date   CESAREAN SECTION N/A 07/19/2014   Procedure: CESAREAN SECTION;  Surgeon: Jeani Hawking, MD;  Location: [facility];  Service: Obstetrics;  Laterality: N/A;   CESAREAN SECTION N/A 09/14/2015   Procedure: REPEAT CESAREAN SECTION;  Surgeon: Marcelle Overlie, MD;  Location: [facility];  Service: Obstetrics;  Laterality: N/A;   JOINT REPLACEMENT     bilateral knees   KNEE ARTHROSCOPY     Multiple   REPLACEMENT TOTAL KNEE BILATERAL     WISDOM TOOTH EXTRACTION      OB History     Gravida  3   Para  3   Term  2   Preterm      AB      Living  2      SAB      IAB      Ectopic      Multiple  0   Live Births  2            Home Medications    Prior to Admission medications   Medication Sig Start Date End Date Taking? Authorizing Provider  doxycycline (VIBRAMYCIN) 100 MG  capsule Take 1 capsule (100 mg total) by mouth 2 (two) times daily. 08/01/23  Yes Particia Nearing, PA-C  fluticasone (FLOVENT HFA) 110 MCG/ACT inhaler Inhale 2 puffs into the lungs 2 (two) times daily. 08/01/23  Yes Particia Nearing, PA-C  promethazine-dextromethorphan (PROMETHAZINE-DM) 6.25-15 MG/5ML syrup Take 5 mLs by mouth 4 (four) times daily as needed. 08/01/23  Yes Particia Nearing, PA-C  ALPRAZolam Prudy Feeler) 0.25 MG tablet Take 1 tablet (0.25 mg total) by mouth 2 (two) times daily as needed for anxiety. 11/20/19   Harold Hedge, MD  amoxicillin-clavulanate (AUGMENTIN) 875-125 MG tablet Take 1 tablet by mouth every 12 (twelve) hours. 07/25/23   Pollyann Savoy, MD  botulinum toxin Type A (BOTOX) 200 units injection INJECT 155 UNITS INTRAMUSCULARLY INTO HEAD AND NECK EVERY 12   WEEKS 05/06/23   Glean Salvo, NP  labetalol (NORMODYNE) 100 MG tablet Take 200 mg by mouth in the morning and at bedtime. 04/24/20   [provider]  ondansetron (ZOFRAN-ODT) 4 MG disintegrating tablet Take 1-2 tablets (4-8 mg total) by mouth every 8 (eight) hours as needed. 08/15/22   Glean Salvo, NP  progesterone (PROMETRIUM) 100 MG capsule Take 1 tablet by mouth daily.    [provider]  Rimegepant Sulfate (NURTEC) 75 MG TBDP Take 1 tablet (75 mg total) by mouth every other day. 05/28/23   Glean Salvo, NP  rizatriptan (MAXALT-MLT) 10 MG disintegrating tablet Take 1 tablet (10 mg total) by mouth as needed for migraine. May repeat in 2 hours if needed 08/15/22   Glean Salvo, NP  tiZANidine (ZANAFLEX) 2 MG tablet Take 1 tablet (2 mg total) by mouth every 6 (six) hours as needed for muscle spasms. 12/03/22   Glean Salvo, NP  VENTOLIN HFA 108 (90 Base) MCG/ACT inhaler Inhale 1 puff into the lungs every 4 (four) hours as needed for wheezing or shortness of breath. 09/27/19   [provider]    Family History Family History  Problem Relation Age of Onset   Hypertension Mother    Hypertension Father    Breast cancer Paternal Aunt 23   Hypertension Maternal Grandmother    Hypertension Maternal Grandfather    Hypertension Paternal Grandmother    Hypertension Paternal Grandfather    Migraines Neg Hx     Social History Social History   Tobacco Use   Smoking status: Never   Smokeless tobacco: Never  Vaping Use   Vaping status: Never Used  Substance Use Topics   Alcohol use: No   Drug use: No     Allergies   Latex and Prednisone   Review of Systems Review of Systems Per HPI  Physical Exam Triage Vital Signs ED Triage Vitals  Encounter Vitals Group     BP 08/01/23 1544 (!) 149/99     Systolic BP Percentile --      Diastolic BP Percentile --      Pulse Rate 08/01/23 1544 (!) 106     Resp 08/01/23 1544 18     Temp 08/01/23 1544 98.5 F  (36.9 C)     Temp Source 08/01/23 1544 Oral     SpO2 08/01/23 1544 98 %     Weight --      Height --      Head Circumference --      Peak Flow --      Pain Score 08/01/23 1545 5     Pain Loc --      Pain  Education --      Exclude from [employer] Chart --    No data found.  Updated Vital Signs BP (!) 149/99 (BP Location: Right Arm)   Pulse (!) 106   Temp 98.5 F (36.9 C) (Oral)   Resp 18   LMP 07/30/2023 (Exact Date)   SpO2 98%   Visual Acuity Right Eye Distance:   Left Eye Distance:   Bilateral Distance:    Right Eye Near:   Left Eye Near:    Bilateral Near:     Physical Exam Vitals and nursing note reviewed.  Constitutional:      Appearance: Normal appearance.  HENT:     Head: Atraumatic.     Right Ear: Tympanic membrane and external ear normal.     Left Ear: Tympanic membrane and external ear normal.     Nose: Congestion present.     Mouth/Throat:     Mouth: Mucous membranes are moist.     Pharynx: Posterior oropharyngeal erythema present.  Eyes:     Extraocular Movements: Extraocular movements intact.     Conjunctiva/sclera: Conjunctivae normal.  Cardiovascular:     Rate and Rhythm: Normal rate and regular rhythm.     Heart sounds: Normal heart sounds.  Pulmonary:     Effort: Pulmonary effort is normal.     Breath sounds: Rales present.  Musculoskeletal:        General: Normal range of motion.     Cervical back: Normal range of motion and neck supple.  Skin:    General: Skin is warm and dry.  Neurological:     Mental Status: She is alert and oriented to person, place, and time.  Psychiatric:        Mood and Affect: Mood normal.        Thought Content: Thought content normal.      UC Treatments / Results  Labs (all labs ordered are listed, but only abnormal results are displayed) Labs Reviewed - No data to display  EKG   Radiology No results found.  Procedures Procedures (including critical care time)  Medications Ordered in  UC Medications - No data to display  Initial Impression / Assessment and Plan / UC Course  I have reviewed the triage vital signs and the nursing notes.  Pertinent labs & imaging results that were available during my care of the patient were reviewed by me and considered in my medical decision making (see chart for details).     Given duration, worsening course despite Augmentin will treat for possible atypical bacterial infection with doxycycline, start on Flovent in addition to albuterol as needed and Phenergan DM as needed.  Over-the-counter medications and home care reviewed, return for worsening symptoms. Final Clinical Impressions(s) / UC Diagnoses   Final diagnoses:  Lower respiratory infection  Mild intermittent asthma with acute exacerbation   Discharge Instructions   None    ED Prescriptions     Medication Sig Dispense Auth. Provider   doxycycline (VIBRAMYCIN) 100 MG capsule Take 1 capsule (100 mg total) by mouth 2 (two) times daily. 20 capsule Particia Nearing, PA-C   fluticasone [facility]) 110 MCG/ACT inhaler Inhale 2 puffs into the lungs 2 (two) times daily. 1 each Particia Nearing, PA-C   promethazine-dextromethorphan (PROMETHAZINE-DM) 6.25-15 MG/5ML syrup Take 5 mLs by mouth 4 (four) times daily as needed. 100 mL Particia Nearing, [place]      PDMP not reviewed this encounter.   Particia Nearing, [place] 08/01/23 1711

## 2023-08-06 ENCOUNTER — Telehealth: Payer: Self-pay | Admitting: Neurology

## 2023-08-06 NOTE — Telephone Encounter (Signed)
Submitted auth renewal request via CMM, status is pending. Key: [id number]

## 2023-08-07 NOTE — Telephone Encounter (Signed)
Received approval, pt will continue to fill through [facility].  Auth#: [id number] (08/06/23-11/04/23)

## 2023-08-13 NOTE — Telephone Encounter (Signed)
 Returned [employer] call, they just needed to set up delivery. Botox  TBD 2/12.

## 2023-08-13 NOTE — Telephone Encounter (Signed)
Jillian,  Is this something we need to call or you? Thanks,  Production assistant, radio

## 2023-08-13 NOTE — Telephone Encounter (Addendum)
 [facility] [place]) Have clinical questions to ask the nurse before scheduling delivery. Please contact [phone]

## 2023-08-21 ENCOUNTER — Other Ambulatory Visit: Payer: Self-pay | Admitting: Neurology

## 2023-08-21 NOTE — Telephone Encounter (Signed)
Rx refilled.

## 2023-08-26 ENCOUNTER — Ambulatory Visit (INDEPENDENT_AMBULATORY_CARE_PROVIDER_SITE_OTHER): Payer: BC Managed Care – PPO | Admitting: Neurology

## 2023-08-26 DIAGNOSIS — G43709 Chronic migraine without aura, not intractable, without status migrainosus: Secondary | ICD-10-CM | POA: Diagnosis not present

## 2023-08-26 MED ORDER — NURTEC 75 MG PO TBDP: 75.0000 mg | ORAL_TABLET | ORAL | 11 refills | Status: AC | PRN

## 2023-08-26 MED ORDER — ONABOTULINUMTOXINA 200 UNITS IJ SOLR
155.0000 [IU] | Freq: Once | INTRAMUSCULAR | Status: AC
  Administered 2023-08-26: 155 [IU] via INTRAMUSCULAR

## 2023-08-26 NOTE — Progress Notes (Addendum)
   BOTOX PROCEDURE NOTE FOR MIGRAINE HEADACHE   HISTORY: Kelly [name] is here for Botox. Last was 05/28/23 with me.  She did excellent with Botox.  Had 0 migraines.  Insurance would not fill Nurtec every other day.  Has not had to take it for rescue.  Description of procedure:  The patient was placed in a sitting position. The standard protocol was used for Botox as follows, with 5 units of Botox injected at each site:   -Procerus muscle, midline injection  -Corrugator muscle, bilateral injection  -Frontalis muscle, bilateral injection, with 2 sites each side, medial injection was performed in the upper one third of the frontalis muscle, in the region vertical from the medial inferior edge of the superior orbital rim. The lateral injection was again in the upper one third of the forehead vertically above the lateral limbus of the cornea, 1.5 cm lateral to the medial injection site.  -Temporalis muscle injection, 4 sites, bilaterally. The first injection was 3 cm above the tragus of the ear, second injection site was 1.5 cm to 3 cm up from the first injection site in line with the tragus of the ear. The third injection site was 1.5-3 cm forward between the first 2 injection sites. The fourth injection site was 1.5 cm posterior to the second injection site.  -Occipitalis muscle injection, 3 sites, bilaterally. The first injection was done one half way between the occipital protuberance and the tip of the mastoid process behind the ear. The second injection site was done lateral and superior to the first, 1 fingerbreadth from the first injection. The third injection site was 1 fingerbreadth superiorly and medially from the first injection site.  -Cervical paraspinal muscle injection, 2 sites, bilateral, the first injection site was 1 cm from the midline of the cervical spine, 3 cm inferior to the lower border of the occipital protuberance. The second injection site was 1.5 cm superiorly and  laterally to the first injection site.  -Trapezius muscle injection was performed at 3 sites, bilaterally. The first injection site was in the upper trapezius muscle halfway between the inflection point of the neck, and the acromion. The second injection site was one half way between the acromion and the first injection site. The third injection was done between the first injection site and the inflection point of the neck.   A 200 unit bottle of Botox was used, 155 units were injected, the rest of the Botox was wasted. The patient tolerated the procedure well, there were no complications of the above procedure.  Botox NDC 7377478059 Lot number [id number] Expiration date 10/2025  SP  I will send in Nurtec number 8 tablets to take as needed for acute migraine headache.  Meds ordered this encounter  Medications   botulinum toxin Type A (BOTOX) injection 155 Units    Botox- 200 units x 1 vial Lot: [id number] Expiration: 10/2025 NDC: 3086-5784-69  Bacteriostatic 0.9% Sodium Chloride- 4 mL  Lot: [id number] Expiration: 05/08/2024 NDC: 4132-4401-02  Dx: V25.366 S/P  Witnessed by April J RN   Rimegepant Sulfate (NURTEC) 75 MG TBDP    Sig: Take 1 tablet (75 mg total) by mouth as needed.    Dispense:  8 tablet    Refill:  11

## 2023-08-26 NOTE — Progress Notes (Signed)
 Botox- 200 units x 1 vial Lot: [id number] Expiration: 10/2025 NDC: 8657-8469-62  Bacteriostatic 0.9% Sodium Chloride- 4 mL  Lot: [id number] Expiration: 05/08/2024 NDC: 3244-0102-72  Dx: Z36.644 S/P  Witnessed by April J RN

## 2023-08-28 ENCOUNTER — Telehealth: Payer: Self-pay | Admitting: Pharmacist

## 2023-08-28 NOTE — Telephone Encounter (Signed)
 Pharmacy Patient Advocate Encounter  Received notification from [facility] that Prior Authorization for Nurtec 75MG  dispersible tablets has been APPROVED from 08/28/2023 to 02/20/20226   PA #/Case ID/Reference #: [id number]  Pharmacy notified

## 2023-08-28 NOTE — Telephone Encounter (Signed)
 Pharmacy Patient Advocate Encounter   Received notification from Patient Pharmacy that prior authorization for Nurtec 75MG  dispersible tablets is required/requested.   Insurance verification completed.   The patient is insured through [facility] .   Per test claim: PA required; PA submitted to above mentioned insurance via CoverMyMeds Key/confirmation #/EOC [id number] Status is pending

## 2023-09-25 ENCOUNTER — Other Ambulatory Visit: Payer: Self-pay | Admitting: Neurology

## 2023-09-26 ENCOUNTER — Telehealth: Payer: Self-pay

## 2023-09-26 NOTE — Telephone Encounter (Signed)
 Received a PA renewal request via fax for Botox-faxed form to [facility] attention Jillian.

## 2023-09-29 NOTE — Telephone Encounter (Signed)
 Submitted auth renewal request via CMM, status is pending.  Key: [id number]

## 2023-10-01 NOTE — Telephone Encounter (Signed)
 [facility] won't renew auth until closer to 4/29 exp date, will retry in about a month.

## 2023-10-15 ENCOUNTER — Other Ambulatory Visit: Payer: Self-pay | Admitting: Neurology

## 2023-10-27 NOTE — Telephone Encounter (Signed)
 Attempted to resubmit auth to [facility], they state that they no longer handle pt's plan and PA is handled by [facility]. Completed PA form and faxed with notes to [phone].

## 2023-10-30 ENCOUNTER — Telehealth: Payer: Self-pay | Admitting: Neurology

## 2023-10-30 NOTE — Telephone Encounter (Signed)
 [email or web address] has called re: delivery of Botox  details: delivery date set for 11-04-23 [facility] address and hours verified, 200 units, SDV, 84 days and 1 vial

## 2023-10-30 NOTE — Telephone Encounter (Signed)
Updated appt note

## 2023-10-31 ENCOUNTER — Other Ambulatory Visit: Payer: Self-pay | Admitting: Neurology

## 2023-11-03 NOTE — Telephone Encounter (Signed)
 Called [facility] to check status of Kelly [name], spoke with Elmo. She states Kelly [name] is still pending review and that I should check back in a few days.

## 2023-11-06 NOTE — Telephone Encounter (Signed)
 Per [facility], Kelly [name] is still pending review. I will receive a fax once determination has been made.

## 2023-11-10 NOTE — Telephone Encounter (Signed)
 Received fax from [facility] that pt has been approved. She will need to fill through [facility] in the future, we already have Botox  for visit next week. Please send rx to [facility] (phone # [phone]) with a note asking them to delay fill for 12 weeks, thank you!  Auth#: [id number] (11/10/23-11/09/24)

## 2023-11-11 MED ORDER — ONABOTULINUMTOXINA 200 UNITS IJ SOLR: 200.0000 [IU] | INTRAMUSCULAR | 2 refills | Status: AC

## 2023-11-11 NOTE — Addendum Note (Signed)
 Addended by: Randi Buster on: 11/11/2023 07:18 AM   Modules accepted: Orders

## 2023-11-19 ENCOUNTER — Ambulatory Visit (INDEPENDENT_AMBULATORY_CARE_PROVIDER_SITE_OTHER): Admitting: Neurology

## 2023-11-19 VITALS — BP 132/84

## 2023-11-19 DIAGNOSIS — G43709 Chronic migraine without aura, not intractable, without status migrainosus: Secondary | ICD-10-CM | POA: Diagnosis not present

## 2023-11-19 MED ORDER — ONABOTULINUMTOXINA 200 UNITS IJ SOLR
155.0000 [IU] | Freq: Once | INTRAMUSCULAR | Status: AC
Start: 2023-11-19 — End: 2023-11-19
  Administered 2023-11-19: 155 [IU] via INTRAMUSCULAR

## 2023-11-19 NOTE — Progress Notes (Signed)
 Botox - 200 units x 1 vial Lot: [id number] Expiration: 2027/09 NDC: 0023-3921-02  Bacteriostatic 0.9% Sodium Chloride - 4 mL  Lot: [id number] Expiration: 05/08/24 NDC: 1914782956  Dx: O13.086  S/P  Witnessed by Laird Pih CMA

## 2023-11-19 NOTE — Progress Notes (Signed)
   BOTOX  PROCEDURE NOTE FOR MIGRAINE HEADACHE  HISTORY: Kelly [name] is here for Botox .  Last was 08/26/2023 with me.  Migraines did great, had ZERO full blown migraines, but had a few near migraine headaches, she did have breakthrough menstrual cycle, thinks headaches were hormone related. Has not needed the Nurtec. Looks like # 8 tablets were approved for the next year, she isn't sure the cost through insurance.   Description of procedure:  The patient was placed in a sitting position. The standard protocol was used for Botox  as follows, with 5 units of Botox  injected at each site:   -Procerus muscle, midline injection  -Corrugator muscle, bilateral injection  -Frontalis muscle, bilateral injection, with 2 sites each side, medial injection was performed in the upper one third of the frontalis muscle, in the region vertical from the medial inferior edge of the superior orbital rim. The lateral injection was again in the upper one third of the forehead vertically above the lateral limbus of the cornea, 1.5 cm lateral to the medial injection site.  -Temporalis muscle injection, 4 sites, bilaterally. The first injection was 3 cm above the tragus of the ear, second injection site was 1.5 cm to 3 cm up from the first injection site in line with the tragus of the ear. The third injection site was 1.5-3 cm forward between the first 2 injection sites. The fourth injection site was 1.5 cm posterior to the second injection site.  -Occipitalis muscle injection, 3 sites, bilaterally. The first injection was done one half way between the occipital protuberance and the tip of the mastoid process behind the ear. The second injection site was done lateral and superior to the first, 1 fingerbreadth from the first injection. The third injection site was 1 fingerbreadth superiorly and medially from the first injection site.  -Cervical paraspinal muscle injection, 2 sites, bilateral, the first injection site was 1  cm from the midline of the cervical spine, 3 cm inferior to the lower border of the occipital protuberance. The second injection site was 1.5 cm superiorly and laterally to the first injection site.  -Trapezius muscle injection was performed at 3 sites, bilaterally. The first injection site was in the upper trapezius muscle halfway between the inflection point of the neck, and the acromion. The second injection site was one half way between the acromion and the first injection site. The third injection was done between the first injection site and the inflection point of the neck.   A 200 unit bottle of Botox  was used, 155 units were injected, the rest of the Botox  was wasted. The patient tolerated the procedure well, there were no complications of the above procedure.  Botox  NDC 1610-9604-54 Lot number [id number] Expiration date 03/2026 SP

## 2023-11-24 ENCOUNTER — Ambulatory Visit: Payer: BC Managed Care – PPO | Admitting: Neurology

## 2023-12-05 ENCOUNTER — Other Ambulatory Visit: Payer: Self-pay | Admitting: Neurology

## 2024-01-28 ENCOUNTER — Telehealth: Payer: Self-pay | Admitting: Neurology

## 2024-01-28 NOTE — Telephone Encounter (Signed)
 Elisa from [facility] called to schedule BOTOX  Delivery for  7/29 Tues  Viles 1 Unit inj 200 Signature required.

## 2024-01-29 NOTE — Telephone Encounter (Signed)
Updated appt note

## 2024-02-11 ENCOUNTER — Ambulatory Visit (INDEPENDENT_AMBULATORY_CARE_PROVIDER_SITE_OTHER): Admitting: Neurology

## 2024-02-11 VITALS — BP 142/88

## 2024-02-11 DIAGNOSIS — G43709 Chronic migraine without aura, not intractable, without status migrainosus: Secondary | ICD-10-CM | POA: Diagnosis not present

## 2024-02-11 MED ORDER — ONABOTULINUMTOXINA 200 UNITS IJ SOLR
155.0000 [IU] | Freq: Once | INTRAMUSCULAR | Status: AC
  Administered 2024-02-11: 155 [IU] via INTRAMUSCULAR

## 2024-02-11 NOTE — Progress Notes (Signed)
   BOTOX  PROCEDURE NOTE FOR MIGRAINE HEADACHE  HISTORY: Kelly [name] is here for Botox . Last was 11/19/23 with me. Started taking Contrave for weight loss in the last month. In the last month more headaches, migraine for the last 3 days. The 1st 2 months no migraines with Botox . Didn't get her Nurtec, sent # 8 tablets in Feb, needs to get filled. Had some she took, took the edge. When she started Contrave, it messed with her menstrual cycle, more hormone changes.   Description of procedure:  The patient was placed in a sitting position. The standard protocol was used for Botox  as follows, with 5 units of Botox  injected at each site:  -Procerus muscle, midline injection  -Corrugator muscle, bilateral injection  -Frontalis muscle, bilateral injection, with 2 sites each side, medial injection was performed in the upper one third of the frontalis muscle, in the region vertical from the medial inferior edge of the superior orbital rim. The lateral injection was again in the upper one third of the forehead vertically above the lateral limbus of the cornea, 1.5 cm lateral to the medial injection site.  -Temporalis muscle injection, 4 sites, bilaterally. The first injection was 3 cm above the tragus of the ear, second injection site was 1.5 cm to 3 cm up from the first injection site in line with the tragus of the ear. The third injection site was 1.5-3 cm forward between the first 2 injection sites. The fourth injection site was 1.5 cm posterior to the second injection site.  -Occipitalis muscle injection, 3 sites, bilaterally. The first injection was done one half way between the occipital protuberance and the tip of the mastoid process behind the ear. The second injection site was done lateral and superior to the first, 1 fingerbreadth from the first injection. The third injection site was 1 fingerbreadth superiorly and medially from the first injection site.  -Cervical paraspinal muscle injection, 2  sites, bilateral, the first injection site was 1 cm from the midline of the cervical spine, 3 cm inferior to the lower border of the occipital protuberance. The second injection site was 1.5 cm superiorly and laterally to the first injection site.  -Trapezius muscle injection was performed at 3 sites, bilaterally. The first injection site was in the upper trapezius muscle halfway between the inflection point of the neck, and the acromion. The second injection site was one half way between the acromion and the first injection site. The third injection was done between the first injection site and the inflection point of the neck.   A 200 unit bottle of Botox  was used, 155 units were injected, the rest of the Botox  was wasted. The patient tolerated the procedure well, there were no complications of the above procedure.  Botox  NDC 9976-6078-97 Lot number [id number] Expiration date 04/2026 SP

## 2024-02-11 NOTE — Progress Notes (Signed)
 Botox - 200 units x 1 vial Lot: [id number] Expiration: 2027/10 NDC: 0023-3921-02  Bacteriostatic 0.9% Sodium Chloride - 4 mL  Lot: [id number] Expiration: 05/07/25 NDC: 9590803397  Dx: H56.290  S/P Witnessed by a jones rn

## 2024-04-15 ENCOUNTER — Other Ambulatory Visit: Payer: Self-pay | Admitting: Neurology

## 2024-05-12 ENCOUNTER — Ambulatory Visit (INDEPENDENT_AMBULATORY_CARE_PROVIDER_SITE_OTHER): Admitting: Neurology

## 2024-05-12 VITALS — BP 164/102 | HR 85

## 2024-05-12 DIAGNOSIS — G43709 Chronic migraine without aura, not intractable, without status migrainosus: Secondary | ICD-10-CM | POA: Diagnosis not present

## 2024-05-12 MED ORDER — ONABOTULINUMTOXINA 200 UNITS IJ SOLR
155.0000 [IU] | Freq: Once | INTRAMUSCULAR | Status: AC
  Administered 2024-05-12: 155 [IU] via INTRAMUSCULAR

## 2024-05-12 MED ORDER — METHOCARBAMOL 500 MG PO TABS: 500.0000 mg | ORAL_TABLET | Freq: Every evening | ORAL | 5 refills | Status: AC | PRN

## 2024-05-12 NOTE — Progress Notes (Addendum)
 Patient: Kelly [name] Date of Birth: Apr 02, 1981  Reason for Visit: Follow up History from: Patient Primary Neurologist: Ahern/Camara  ASSESSMENT AND PLAN 43 y.o. year old female   1.  Chronic migraine headache - Excellent benefit with Botox , continue Botox  every 3 months for migraine prevention - Continue Nurtec 75 mg as needed for acute headache - Robaxin  500 mg as needed for muscle tension with migraine, Zofran  for nausea - BP elevated, follow-up with PCP - Follow-up in 3 months for Botox   HISTORY OF PRESENT ILLNESS: Today 05/12/24 05/12/24 SS: Here today for Botox .  Migraines have done excellent with Botox .  In the last 3 months only 1 migraine.  Has not needed Nurtec but does work excellent.  Recently dealing with right jaw locking, triggering migraines on the right side. Her mouth guard broke, saving money to get in with dentist.  Has tizanidine  as needed for migraine makes her too sleepy, switch to Robaxin  with less drowsiness. A lot of stress, her husband is out of work from injury that is prolonged. BP is up today.    HISTORY  04/09/22 Dr. Ines HPI:  Kelly [name] is a 43 y.o. female here as requested by Cottie Donnice PARAS, MD for headache.  I reviewed ED notes from 03/23/2022 patient presented with a headache, Dr. Cottie, she reported the headache began gradually about 7 days prior, persistent, involving both the left temple right temple the front of the head with sensitivity to light.  She was concerned because of a family history of brain aneurysm in the patient's maternal grandfather and maternal great grandmother.  I have seen patient in the past it appears in 2017 I reviewed my notes from below, appears an MRI in 2017 was ordered and we gave her sumatriptan , MRI in 2017 was negative.  CT of the head and CTA of the head were both normal in the emergency room recently, I reviewed images and agree.  I do not see any additional information on Care Everywhere in regards to her  headaches.   She has had a headache for the last 3 weeks without resolution. She has pulsating/pounding/throbbing, mostly on the left, light and sound sensitivity, nausea, no vomiting, hurts to move, Nurtec/ubrelvy was minimal. At its max it has been severe and went to the ED, had imaging, can't even move. Usually her blood pressure is on the lower side and elevated just due to pain.her migraines last 2-3 days. No medication overuse. No aura. The last 4 months she has had daily headaches, at least 10 migraine days a month. No vision changes, unknown triggers, may be worse with period, nothing is making it better. She has been going back to the gym but not doing strenuous exercise, they are regimented on their food. No other focal neurologic deficits, associated symptoms, inciting events or modifiable factors.   Reviewed notes, labs and imaging from outside physicians, which showed:   From a thorough review of records, medications tried that can be used in migraine management include Tylenol , amitriptyline (side effects), amlodipine , Fioricet , Stadol , Celebrex  capsule, Benadryl  capsule, Cymbalta , ibuprofen , Toradol  injections, Lamictal, magnesium  IV, Robaxin , Solu-Medrol  injections, Reglan  tablets and injections, Zofran  oral and injection, Compazine  oral and injections, sumatriptan  tablets, tizanidine , topiramate , Effexor, nurtec, ubrelvy, reglan , labetalol, ajovy    REVIEW OF SYSTEMS: Out of a complete 14 system review of symptoms, the patient complains only of the following symptoms, and all other reviewed systems are negative.  See HPI  ALLERGIES: Allergies  Allergen Reactions  Latex Hives and Itching    Condom with u/s probe caused severe internal itching   Prednisone Other (See Comments)    Reaction:  Knee & joint pain.    HOME MEDICATIONS: Outpatient Medications Prior to Visit  Medication Sig Dispense Refill   ALPRAZolam  (XANAX ) 0.25 MG tablet Take 1 tablet (0.25 mg total) by mouth 2  (two) times daily as needed for anxiety. 30 tablet 0   amoxicillin -clavulanate (AUGMENTIN ) 875-125 MG tablet Take 1 tablet by mouth every 12 (twelve) hours. 14 tablet 0   BOTOX  200 units injection INJECT 155 UNITS INTRAMUSCULARLY INTO HEAD AND NECK EVERY 12 WEEKS 1 each 1   botulinum toxin Type A  (BOTOX ) 200 units injection Inject 200 Units into the muscle every 3 (three) months. 1 each 2   doxycycline  (VIBRAMYCIN ) 100 MG capsule Take 1 capsule (100 mg total) by mouth 2 (two) times daily. 20 capsule 0   fluticasone  (FLOVENT  HFA) 110 MCG/ACT inhaler Inhale 2 puffs into the lungs 2 (two) times daily. 1 each 0   labetalol (NORMODYNE) 100 MG tablet Take 200 mg by mouth in the morning and at bedtime.     ondansetron  (ZOFRAN -ODT) 4 MG disintegrating tablet Take 1-2 tablets (4-8 mg total) by mouth every 8 (eight) hours as needed. 30 tablet 3   progesterone (PROMETRIUM) 100 MG capsule Take 1 tablet by mouth daily.     promethazine -dextromethorphan (PROMETHAZINE -DM) 6.25-15 MG/5ML syrup Take 5 mLs by mouth 4 (four) times daily as needed. 100 mL 0   Rimegepant Sulfate (NURTEC) 75 MG TBDP Take 1 tablet (75 mg total) by mouth as needed. 8 tablet 11   rizatriptan  (MAXALT -MLT) 10 MG disintegrating tablet Take 1 tablet (10 mg total) by mouth as needed for migraine. May repeat in 2 hours if needed 9 tablet 11   VENTOLIN  HFA 108 (90 Base) MCG/ACT inhaler Inhale 1 puff into the lungs every 4 (four) hours as needed for wheezing or shortness of breath.     tiZANidine  (ZANAFLEX ) 2 MG tablet TAKE 1 TABLET BY MOUTH EVERY 6 HOURS AS NEEDED FOR MUSCLE SPASMS. 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   Osteonecrosis (HCC)    Raynaud's disease    TMJ locking    right side    PAST SURGICAL HISTORY: Past Surgical History:  Procedure Laterality Date   CESAREAN SECTION N/A 07/19/2014   Procedure: CESAREAN SECTION;  Surgeon: Rosaline LITTIE Cobble, MD;  Location: [facility];  Service: Obstetrics;  Laterality: N/A;   CESAREAN SECTION N/A 09/14/2015   Procedure: REPEAT CESAREAN SECTION;  Surgeon: Rosaline Cobble, MD;  Location: [facility];  Service: Obstetrics;  Laterality: N/A;   JOINT REPLACEMENT     bilateral knees   KNEE ARTHROSCOPY     Multiple   REPLACEMENT TOTAL KNEE BILATERAL  WISDOM TOOTH EXTRACTION      FAMILY HISTORY: Family History  Problem Relation Age of Onset   Hypertension Mother    Hypertension Father    Breast cancer Paternal Aunt 28   Hypertension Maternal Grandmother    Hypertension Maternal Grandfather    Hypertension Paternal Grandmother    Hypertension Paternal Grandfather    Migraines Neg Hx     SOCIAL HISTORY: Social History   Socioeconomic History   Marital status: Married    Spouse name: Luke Rigsbee   Number of children: 2   Years of education: Bachelor's   Highest education level: Not on file  Occupational History   Not on file  Tobacco Use   Smoking status: Never   Smokeless tobacco: Never  Vaping Use   Vaping status: Never Used  Substance and Sexual Activity   Alcohol use: No   Drug use: No   Sexual activity: Yes  Other Topics Concern   Not on file  Social History Narrative   Lives with husband, Joey   Caffeine  use: Drinks 1 cup coffee per day   No tea or soda   Social Drivers of Corporate Investment Banker Strain: Not on file  Food Insecurity: Not on file   Transportation Needs: Not on file  Physical Activity: Not on file  Stress: Not on file  Social Connections: Not on file  Intimate Partner Violence: Not on file    PHYSICAL EXAM  Vitals:   05/12/24 0906 05/12/24 0916  BP: (!) 161/97 (!) 164/102  Pulse: 85    There is no height or weight on file to calculate BMI.  Generalized: Well developed, in no acute distress  Neurological examination  Mentation: Alert oriented to time, place, history taking. Follows all commands speech and language fluent Cranial nerve II-XII: Pupils were equal round reactive to light. Extraocular movements were full, visual field were full on confrontational test. Facial sensation and strength were normal. Head turning and shoulder shrug  were normal and symmetric. Motor: The motor testing reveals 5 over 5 strength of all 4 extremities. Good symmetric motor tone is noted throughout.  Sensory: Sensory testing is intact to soft touch on all 4 extremities. No evidence of extinction is noted.  Coordination: Cerebellar testing reveals good finger-nose-finger bilaterally Gait and station: Gait is normal.   DIAGNOSTIC DATA (LABS, IMAGING, TESTING) - I reviewed patient records, labs, notes, testing and imaging myself where available.  Lab Results  Component Value Date   WBC 9.6 07/24/2023   HGB 11.9 (L) 07/24/2023   HCT 35.4 (L) 07/24/2023   MCV 84.7 07/24/2023   PLT 518 (H) 07/24/2023      Component Value Date/Time   NA 139 07/24/2023 2332   K 3.5 07/24/2023 2332   CL 102 07/24/2023 2332   CO2 27 07/24/2023 2332   GLUCOSE 84 07/24/2023 2332   BUN 9 07/24/2023 2332   CREATININE 0.80 07/24/2023 2332   CREATININE 0.78 11/28/2011 1358   CALCIUM 9.2 07/24/2023 2332   PROT 7.4 07/24/2023 2332   ALBUMIN 4.3 07/24/2023 2332   AST 25 07/24/2023 2332   ALT 26 07/24/2023 2332   ALKPHOS 67 07/24/2023 2332   BILITOT 0.3 07/24/2023 2332   GFRNONAA >60 07/24/2023 2332   GFRAA >60 11/19/2019 0958   No results  found for: CHOL, HDL, LDLCALC, LDLDIRECT, TRIG, CHOLHDL No results found for: YHAJ8R No results found for: VITAMINB12 Lab Results  Component Value Date   TSH 1.196 11/28/2011    Lauraine  Gayland MANDES, DNP 05/12/2024, 9:38 AM [facility] [address], [address] [place], [place] [address] [phone]

## 2024-05-12 NOTE — Progress Notes (Signed)
 Botox - 200 units x 1 vial Lot: [id number] Expiration: 12/2025 NDC: 9976-6078-97  Bacteriostatic 0.9% Sodium Chloride - 4 mL  Lot: [id number] Expiration: 05/07/2025 NDC: 9590-8033-97  Dx: H56.290 S/P  Witnessed by Rojean DEL CMA

## 2024-05-12 NOTE — Progress Notes (Signed)
   BOTOX  PROCEDURE NOTE FOR MIGRAINE HEADACHE   HISTORY: Kelly [name] is here for Botox . Last was 02/11/24 with me. On Contrave for weight loss. Only 1 bad migraine since the last injection. Has not needed the Nurtec. The tizanidine  was not helpful, tried Robaxin  with better benefit. Often she clenches at night, triggers migraine. When her right jaw locks up, triggers migraines.   Description of procedure:  The patient was placed in a sitting position. The standard protocol was used for Botox  as follows, with 5 units of Botox  injected at each site:  -Procerus muscle, midline injection  -Corrugator muscle, bilateral injection  -Frontalis muscle, bilateral injection, with 2 sites each side, medial injection was performed in the upper one third of the frontalis muscle, in the region vertical from the medial inferior edge of the superior orbital rim. The lateral injection was again in the upper one third of the forehead vertically above the lateral limbus of the cornea, 1.5 cm lateral to the medial injection site.  -Temporalis muscle injection, 4 sites, bilaterally. The first injection was 3 cm above the tragus of the ear, second injection site was 1.5 cm to 3 cm up from the first injection site in line with the tragus of the ear. The third injection site was 1.5-3 cm forward between the first 2 injection sites. The fourth injection site was 1.5 cm posterior to the second injection site.  -Occipitalis muscle injection, 3 sites, bilaterally. The first injection was done one half way between the occipital protuberance and the tip of the mastoid process behind the ear. The second injection site was done lateral and superior to the first, 1 fingerbreadth from the first injection. The third injection site was 1 fingerbreadth superiorly and medially from the first injection site.  -Cervical paraspinal muscle injection, 2 sites, bilateral, the first injection site was 1 cm from the midline of the cervical  spine, 3 cm inferior to the lower border of the occipital protuberance. The second injection site was 1.5 cm superiorly and laterally to the first injection site.  -Trapezius muscle injection was performed at 3 sites, bilaterally. The first injection site was in the upper trapezius muscle halfway between the inflection point of the neck, and the acromion. The second injection site was one half way between the acromion and the first injection site. The third injection was done between the first injection site and the inflection point of the neck.   A 200 unit bottle of Botox  was used, 155 units were injected, the rest of the Botox  was wasted. The patient tolerated the procedure well, there were no complications of the above procedure.  Botox  NDC 9976-6078-97 Lot number [id number] Expiration date 12/2025 SP

## 2024-07-22 ENCOUNTER — Telehealth: Payer: Self-pay | Admitting: Neurology

## 2024-07-22 NOTE — Telephone Encounter (Signed)
 [facility]) schedule delivery of Botox  200 units 07/27/24

## 2024-08-02 ENCOUNTER — Telehealth: Payer: Self-pay | Admitting: Pharmacist

## 2024-08-02 ENCOUNTER — Other Ambulatory Visit (HOSPITAL_COMMUNITY): Payer: Self-pay

## 2024-08-02 NOTE — Telephone Encounter (Signed)
 Pharmacy Patient Advocate Encounter   Received notification from CoverMyMeds that prior authorization for Nurtec is due for renewal.   Insurance verification completed.   The patient is insured through [facility].  Action: Medication is now available without a prior authorization. Archived Key: [facility]

## 2024-08-10 ENCOUNTER — Ambulatory Visit: Admitting: Neurology

## 2024-08-10 VITALS — BP 138/88

## 2024-08-10 DIAGNOSIS — G43709 Chronic migraine without aura, not intractable, without status migrainosus: Secondary | ICD-10-CM | POA: Diagnosis not present

## 2024-08-10 MED ORDER — ONABOTULINUMTOXINA 200 UNITS IJ SOLR
155.0000 [IU] | Freq: Once | INTRAMUSCULAR | Status: AC
  Administered 2024-08-10: 155 [IU] via INTRAMUSCULAR

## 2024-08-10 NOTE — Progress Notes (Signed)
" ° °  BOTOX  PROCEDURE NOTE FOR MIGRAINE HEADACHE   HISTORY: Kelly [name] is here for Botox . Last was 05/12/24 with me. Has done great with Botox . Does wear off about 2 weeks before next injection cycle. Uses Robaxin  and Nurtec PRN.   Description of procedure:  The patient was placed in a sitting position. The standard protocol was used for Botox  as follows, with 5 units of Botox  injected at each site:   -Procerus muscle, midline injection  -Corrugator muscle, bilateral injection  -Frontalis muscle, bilateral injection, with 2 sites each side, medial injection was performed in the upper one third of the frontalis muscle, in the region vertical from the medial inferior edge of the superior orbital rim. The lateral injection was again in the upper one third of the forehead vertically above the lateral limbus of the cornea, 1.5 cm lateral to the medial injection site.  -Temporalis muscle injection, 4 sites, bilaterally. The first injection was 3 cm above the tragus of the ear, second injection site was 1.5 cm to 3 cm up from the first injection site in line with the tragus of the ear. The third injection site was 1.5-3 cm forward between the first 2 injection sites. The fourth injection site was 1.5 cm posterior to the second injection site.  -Occipitalis muscle injection, 3 sites, bilaterally. The first injection was done one half way between the occipital protuberance and the tip of the mastoid process behind the ear. The second injection site was done lateral and superior to the first, 1 fingerbreadth from the first injection. The third injection site was 1 fingerbreadth superiorly and medially from the first injection site.  -Cervical paraspinal muscle injection, 2 sites, bilateral, the first injection site was 1 cm from the midline of the cervical spine, 3 cm inferior to the lower border of the occipital protuberance. The second injection site was 1.5 cm superiorly and laterally to the first  injection site.  -Trapezius muscle injection was performed at 3 sites, bilaterally. The first injection site was in the upper trapezius muscle halfway between the inflection point of the neck, and the acromion. The second injection site was one half way between the acromion and the first injection site. The third injection was done between the first injection site and the inflection point of the neck.   A 200 unit bottle of Botox  was used, 155 units were injected, the rest of the Botox  was wasted. The patient tolerated the procedure well, there were no complications of the above procedure.  Botox  NDC 9976-6078-97 Lot number [id number] Expiration date 09/2026 SP   "

## 2024-08-10 NOTE — Progress Notes (Signed)
 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

## 2024-11-11 ENCOUNTER — Ambulatory Visit: Admitting: Neurology
